# Patient Record
Sex: Female | Born: 1950 | Race: White | Hispanic: No | Marital: Married | Smoking: Never smoker
Health system: Southern US, Community
[De-identification: ages and names within clinical notes are randomized; demographics above are authoritative.]

## PROBLEM LIST (undated history)

## (undated) DIAGNOSIS — F329 Major depressive disorder, single episode, unspecified: Secondary | ICD-10-CM

## (undated) DIAGNOSIS — R519 Headache, unspecified: Secondary | ICD-10-CM

## (undated) DIAGNOSIS — M1990 Unspecified osteoarthritis, unspecified site: Secondary | ICD-10-CM

## (undated) DIAGNOSIS — N840 Polyp of corpus uteri: Secondary | ICD-10-CM

## (undated) DIAGNOSIS — M810 Age-related osteoporosis without current pathological fracture: Secondary | ICD-10-CM

## (undated) DIAGNOSIS — E0590 Thyrotoxicosis, unspecified without thyrotoxic crisis or storm: Secondary | ICD-10-CM

## (undated) DIAGNOSIS — F419 Anxiety disorder, unspecified: Secondary | ICD-10-CM

## (undated) DIAGNOSIS — K219 Gastro-esophageal reflux disease without esophagitis: Secondary | ICD-10-CM

## (undated) DIAGNOSIS — R51 Headache: Secondary | ICD-10-CM

## (undated) DIAGNOSIS — D649 Anemia, unspecified: Secondary | ICD-10-CM

## (undated) DIAGNOSIS — M8580 Other specified disorders of bone density and structure, unspecified site: Secondary | ICD-10-CM

## (undated) DIAGNOSIS — J189 Pneumonia, unspecified organism: Secondary | ICD-10-CM

## (undated) DIAGNOSIS — G459 Transient cerebral ischemic attack, unspecified: Secondary | ICD-10-CM

## (undated) DIAGNOSIS — Z8489 Family history of other specified conditions: Secondary | ICD-10-CM

## (undated) DIAGNOSIS — F32A Depression, unspecified: Secondary | ICD-10-CM

## (undated) DIAGNOSIS — Z9889 Other specified postprocedural states: Secondary | ICD-10-CM

## (undated) DIAGNOSIS — R112 Nausea with vomiting, unspecified: Secondary | ICD-10-CM

## (undated) DIAGNOSIS — E7800 Pure hypercholesterolemia, unspecified: Secondary | ICD-10-CM

## (undated) HISTORY — PX: HYSTEROSCOPY: SHX211

## (undated) HISTORY — DX: Unspecified osteoarthritis, unspecified site: M19.90

## (undated) HISTORY — PX: TOTAL HIP ARTHROPLASTY: SHX124

## (undated) HISTORY — DX: Thyrotoxicosis, unspecified without thyrotoxic crisis or storm: E05.90

## (undated) HISTORY — PX: APPENDECTOMY: SHX54

## (undated) HISTORY — PX: NOSE SURGERY: SHX723

## (undated) HISTORY — PX: NASAL SINUS SURGERY: SHX719

## (undated) HISTORY — PX: DILATION AND CURETTAGE OF UTERUS: SHX78

## (undated) HISTORY — PX: OTHER SURGICAL HISTORY: SHX169

## (undated) HISTORY — DX: Other specified disorders of bone density and structure, unspecified site: M85.80

## (undated) HISTORY — DX: Polyp of corpus uteri: N84.0

## (undated) HISTORY — DX: Pure hypercholesterolemia, unspecified: E78.00

## (undated) HISTORY — DX: Anemia, unspecified: D64.9

## (undated) HISTORY — PX: TUBAL LIGATION: SHX77

## (undated) HISTORY — PX: SHOULDER SURGERY: SHX246

## (undated) HISTORY — PX: TOTAL HIP REVISION: SHX763

## (undated) HISTORY — PX: TONSILLECTOMY: SUR1361

---

## 1997-07-25 ENCOUNTER — Other Ambulatory Visit: Admission: RE | Admit: 1997-07-25 | Discharge: 1997-07-25 | Payer: Self-pay | Admitting: Obstetrics and Gynecology

## 1998-09-03 ENCOUNTER — Other Ambulatory Visit: Admission: RE | Admit: 1998-09-03 | Discharge: 1998-09-03 | Payer: Self-pay | Admitting: Obstetrics and Gynecology

## 1999-09-03 ENCOUNTER — Other Ambulatory Visit: Admission: RE | Admit: 1999-09-03 | Discharge: 1999-09-03 | Payer: Self-pay | Admitting: Obstetrics and Gynecology

## 2000-09-06 ENCOUNTER — Other Ambulatory Visit: Admission: RE | Admit: 2000-09-06 | Discharge: 2000-09-06 | Payer: Self-pay | Admitting: Obstetrics and Gynecology

## 2001-09-08 ENCOUNTER — Other Ambulatory Visit: Admission: RE | Admit: 2001-09-08 | Discharge: 2001-09-08 | Payer: Self-pay | Admitting: Obstetrics and Gynecology

## 2002-08-17 ENCOUNTER — Encounter (INDEPENDENT_AMBULATORY_CARE_PROVIDER_SITE_OTHER): Payer: Self-pay | Admitting: Specialist

## 2002-08-17 ENCOUNTER — Ambulatory Visit (HOSPITAL_BASED_OUTPATIENT_CLINIC_OR_DEPARTMENT_OTHER): Admission: RE | Admit: 2002-08-17 | Discharge: 2002-08-17 | Payer: Self-pay | Admitting: Obstetrics and Gynecology

## 2002-09-22 ENCOUNTER — Other Ambulatory Visit: Admission: RE | Admit: 2002-09-22 | Discharge: 2002-09-22 | Payer: Self-pay | Admitting: Obstetrics and Gynecology

## 2003-09-24 ENCOUNTER — Other Ambulatory Visit: Admission: RE | Admit: 2003-09-24 | Discharge: 2003-09-24 | Payer: Self-pay | Admitting: Obstetrics and Gynecology

## 2004-09-24 ENCOUNTER — Other Ambulatory Visit: Admission: RE | Admit: 2004-09-24 | Discharge: 2004-09-24 | Payer: Self-pay | Admitting: Obstetrics and Gynecology

## 2005-09-28 ENCOUNTER — Other Ambulatory Visit: Admission: RE | Admit: 2005-09-28 | Discharge: 2005-09-28 | Payer: Self-pay | Admitting: Obstetrics and Gynecology

## 2006-08-23 ENCOUNTER — Inpatient Hospital Stay (HOSPITAL_COMMUNITY): Admission: RE | Admit: 2006-08-23 | Discharge: 2006-08-25 | Payer: Self-pay | Admitting: Orthopedic Surgery

## 2006-09-30 ENCOUNTER — Other Ambulatory Visit: Admission: RE | Admit: 2006-09-30 | Discharge: 2006-09-30 | Payer: Self-pay | Admitting: Obstetrics and Gynecology

## 2007-10-11 ENCOUNTER — Other Ambulatory Visit: Admission: RE | Admit: 2007-10-11 | Discharge: 2007-10-11 | Payer: Self-pay | Admitting: Obstetrics and Gynecology

## 2008-05-05 ENCOUNTER — Encounter: Admission: RE | Admit: 2008-05-05 | Discharge: 2008-05-05 | Payer: Self-pay | Admitting: Orthopedic Surgery

## 2008-06-11 ENCOUNTER — Ambulatory Visit: Payer: Self-pay | Admitting: Obstetrics and Gynecology

## 2008-06-20 ENCOUNTER — Ambulatory Visit: Payer: Self-pay | Admitting: Obstetrics and Gynecology

## 2008-09-17 ENCOUNTER — Ambulatory Visit: Payer: Self-pay | Admitting: Obstetrics and Gynecology

## 2008-10-15 ENCOUNTER — Ambulatory Visit: Payer: Self-pay | Admitting: Obstetrics and Gynecology

## 2008-10-15 ENCOUNTER — Other Ambulatory Visit: Admission: RE | Admit: 2008-10-15 | Discharge: 2008-10-15 | Payer: Self-pay | Admitting: Obstetrics and Gynecology

## 2008-10-15 ENCOUNTER — Encounter: Payer: Self-pay | Admitting: Obstetrics and Gynecology

## 2008-10-18 ENCOUNTER — Ambulatory Visit: Payer: Self-pay | Admitting: Obstetrics and Gynecology

## 2008-10-19 ENCOUNTER — Encounter: Payer: Self-pay | Admitting: Cardiology

## 2008-10-27 DIAGNOSIS — E785 Hyperlipidemia, unspecified: Secondary | ICD-10-CM | POA: Insufficient documentation

## 2008-10-29 DIAGNOSIS — M899 Disorder of bone, unspecified: Secondary | ICD-10-CM | POA: Insufficient documentation

## 2008-10-29 DIAGNOSIS — M949 Disorder of cartilage, unspecified: Secondary | ICD-10-CM

## 2008-10-29 DIAGNOSIS — R142 Eructation: Secondary | ICD-10-CM

## 2008-10-29 DIAGNOSIS — R141 Gas pain: Secondary | ICD-10-CM | POA: Insufficient documentation

## 2008-10-29 DIAGNOSIS — R143 Flatulence: Secondary | ICD-10-CM

## 2008-10-29 DIAGNOSIS — F329 Major depressive disorder, single episode, unspecified: Secondary | ICD-10-CM

## 2008-10-30 ENCOUNTER — Ambulatory Visit: Payer: Self-pay | Admitting: Cardiology

## 2008-10-30 DIAGNOSIS — E782 Mixed hyperlipidemia: Secondary | ICD-10-CM

## 2008-11-22 ENCOUNTER — Telehealth: Payer: Self-pay | Admitting: Cardiology

## 2009-01-03 ENCOUNTER — Encounter: Admission: RE | Admit: 2009-01-03 | Discharge: 2009-01-03 | Payer: Self-pay | Admitting: Obstetrics and Gynecology

## 2009-02-13 ENCOUNTER — Ambulatory Visit: Payer: Self-pay | Admitting: Cardiology

## 2009-02-13 LAB — CONVERTED CEMR LAB
ALT: 18 units/L (ref 0–35)
Albumin: 3.9 g/dL (ref 3.5–5.2)
Alkaline Phosphatase: 78 units/L (ref 39–117)
Bilirubin, Direct: 0.1 mg/dL (ref 0.0–0.3)
LDL Cholesterol: 103 mg/dL — ABNORMAL HIGH (ref 0–99)
Total Protein: 6.8 g/dL (ref 6.0–8.3)
Triglycerides: 90 mg/dL (ref 0.0–149.0)
VLDL: 18 mg/dL (ref 0.0–40.0)

## 2009-02-16 HISTORY — PX: BREAST SURGERY: SHX581

## 2009-02-20 ENCOUNTER — Telehealth: Payer: Self-pay | Admitting: Cardiology

## 2009-02-21 ENCOUNTER — Ambulatory Visit (HOSPITAL_BASED_OUTPATIENT_CLINIC_OR_DEPARTMENT_OTHER): Admission: RE | Admit: 2009-02-21 | Discharge: 2009-02-21 | Payer: Self-pay | Admitting: Surgery

## 2009-02-21 ENCOUNTER — Encounter: Admission: RE | Admit: 2009-02-21 | Discharge: 2009-02-21 | Payer: Self-pay | Admitting: Surgery

## 2009-03-08 ENCOUNTER — Telehealth (INDEPENDENT_AMBULATORY_CARE_PROVIDER_SITE_OTHER): Payer: Self-pay | Admitting: *Deleted

## 2009-05-02 ENCOUNTER — Ambulatory Visit (HOSPITAL_COMMUNITY): Admission: RE | Admit: 2009-05-02 | Discharge: 2009-05-02 | Payer: Self-pay | Admitting: Orthopedic Surgery

## 2009-10-16 ENCOUNTER — Ambulatory Visit: Payer: Self-pay | Admitting: Obstetrics and Gynecology

## 2009-10-16 ENCOUNTER — Other Ambulatory Visit: Admission: RE | Admit: 2009-10-16 | Discharge: 2009-10-16 | Payer: Self-pay | Admitting: Obstetrics and Gynecology

## 2009-10-31 ENCOUNTER — Telehealth: Payer: Self-pay | Admitting: Cardiology

## 2009-12-20 ENCOUNTER — Ambulatory Visit: Payer: Self-pay | Admitting: Cardiology

## 2009-12-27 ENCOUNTER — Telehealth: Payer: Self-pay | Admitting: Cardiology

## 2009-12-27 LAB — CONVERTED CEMR LAB
AST: 27 units/L (ref 0–37)
Albumin: 4.4 g/dL (ref 3.5–5.2)
Alkaline Phosphatase: 77 units/L (ref 39–117)
Bilirubin, Direct: 0.1 mg/dL (ref 0.0–0.3)
Cholesterol: 215 mg/dL — ABNORMAL HIGH (ref 0–200)
Total Protein: 6.9 g/dL (ref 6.0–8.3)
Triglycerides: 101 mg/dL (ref 0.0–149.0)

## 2010-03-09 ENCOUNTER — Encounter: Payer: Self-pay | Admitting: Orthopedic Surgery

## 2010-03-18 NOTE — Progress Notes (Signed)
Summary: test results  Phone Note Call from Patient Call back at Home Phone [phone]   Caller: Patient Reason for Call: Lab or Test Results Initial call taken by: Judie Grieve,  December 27, 2009 1:56 PM  Follow-up for Phone Call        I spoke with the pt. Follow-up by: Sherri Rad, RN, BSN,  December 27, 2009 2:21 PM

## 2010-03-18 NOTE — Progress Notes (Signed)
Summary: REFILL  Phone Note Refill Request Message from:  Patient on March 08, 2009 10:47 AM  Refills Requested: Medication #1:  CRESTOR 10 MG TABS 1 once daily. SENT  [facility]  [address] [phone] SENT TO WRONG PHARMACY  Initial call taken by: Judie Grieve,  March 08, 2009 10:48 AM  Follow-up for Phone Call        Rx faxed to [facility] on [address] Follow-up by: Vikki Ports,  March 08, 2009 11:15 AM    Prescriptions: CRESTOR 10 MG TABS (ROSUVASTATIN CALCIUM) 1 once daily  #30 x 6   Entered by:   Vikki Ports   Authorized by:   Gaylord Shih, MD, [facility]   Signed by:   Vikki Ports on 03/08/2009   Method used:   Faxed to ...       [facility] (retail)       [address]       [place], [place]  [id number]       Ph: [phone]       Fax: [phone]   RxID:   [id number]

## 2010-03-18 NOTE — Progress Notes (Signed)
Summary: lab results  Phone Note Call from Patient Call back at [phone]   Caller: Patient Reason for Call: Lab or Test Results Summary of Call: request lab results Initial call taken by: Migdalia Dk,  February 20, 2009 9:18 AM  Follow-up for Phone Call        EXCELLENT RESPONSE TO CRESTOR! NO CHANGE. RECHECK 1 YEAR Follow-up by: Gaylord Shih, MD, [facility],  February 20, 2009 10:31 AM     Appended Document: lab results PT AWARE./CY

## 2010-03-18 NOTE — Progress Notes (Signed)
Summary: questions re order   Phone Note Call from Patient   Caller: Patient Reason for Call: Talk to Nurse Summary of Call: pt has appt 9-19 w/dr Sarah Villarreal -pt requesting to have her cholesteral checked-can we get order-also she doesn't want to make two trips, her appt is at 3p, so she will have to rs with Sarah Villarreal in order to do am labs the same day Initial call taken by: Glynda Jaeger,  October 31, 2009 4:04 PM  Follow-up for Phone Call        I spoke with the pt. I offered to r/s her to 9/20 @ 9:30am with Dr. Daleen Villarreal so she can come fasting. She states she cannot come that date. Appt with Dr. Daleen Villarreal r/s to 10/13. The pt will come fasting so we can check labs the same day. Follow-up by: Sherri Rad, RN, BSN,  October 31, 2009 4:45 PM

## 2010-03-18 NOTE — Assessment & Plan Note (Signed)
Summary: yearly f/u/cy   Primary Provider:  Dr. Oletha Blend   History of Present Illness: Ms Sarah Villarreal returns today for evaluation and management of her hyperlipidemia. Her numbers look remarkably good last year with total cholesterol 191, HDL 70, LDL 103 triglycerides of 90 on just 5 mg of Crestor day. Her weight has been stable. She is not exercising regularly so however. Her she denies any chest pain or ischemic symptoms.  Current Medications (verified): 1)  Paxil 20 Mg Tabs (Paroxetine Hcl) .Marland Kitchen.. 1 Tab Once Daily 2)  Synthroid 75 Mcg Tabs (Levothyroxine Sodium) .Marland Kitchen.. 1 Tab Once Daily 3)  Dyazide 37.5-25 Mg Caps (Triamterene-Hctz) .Marland Kitchen.. 1 Tab Two Times A Day 4)  Crestor 10 Mg Tabs (Rosuvastatin Calcium) .... 1/2 By Mouth Daily  Allergies (verified): 1)  ! Codeine 2)  ! Demerol  Past History:  Past Medical History: Last updated: 10/27/2008 HYPERLIPIDEMIA (ICD-272.4) DEPRESSION (ICD-311) OSTEOPENIA (ICD-733.90) ABDOMINAL BLOATING (ICD-787.3)    Past Surgical History: Last updated: 10/27/2008 Hip Arthroplasty-Total..08/23/2006 .Marland KitchenRobert A. Thurston Hole, M.D. Excision of abdominal lipoma... SURGEON:  Daniel L. Gottsegen, M.D...08/17/2002  Family History: Last updated: 10/27/2008 noncontributory  Social History: Last updated: 10/27/2008 Alcohol Use - yes..seldom Tobacco Use - No.   Risk Factors: Smoking Status: never (10/27/2008)  Review of Systems       negative other than history of present illness  Vital Signs:  Patient profile:   60 year old female Height:      64 inches Weight:      124 pounds BMI:     21.36 Pulse rate:   59 / minute Resp:     16 per minute BP sitting:   112 / 66  (left arm)  Vitals Entered By: Marrion Coy, CNA (December 20, 2009 9:52 AM)  Physical Exam  General:  Well developed, well nourished, in no acute distress. Head:  normocephalic and atraumatic Eyes:  PERRLA/EOM intact; conjunctiva and lids normal. Neck:  Neck supple, no JVD. No  masses, thyromegaly or abnormal cervical nodes. Chest Sarah Villarreal:  no deformities or breast masses noted Lungs:  Clear bilaterally to auscultation and percussion. Heart:  PMI nondisplaced, regular rate and rhythm, normal S1-S2, no carotid bruit Abdomen:  Bowel sounds positive; abdomen soft and non-tender without masses, organomegaly, or hernias noted. No hepatosplenomegaly. Msk:  Back normal, normal gait. Muscle strength and tone normal. Pulses:  pulses normal in all 4 extremities Extremities:  No clubbing or cyanosis. Neurologic:  Alert and oriented x 3. Skin:  Intact without lesions or rashes. Psych:  Normal affect.   EKG  Procedure date:  12/20/2009  Findings:      sinus bradycardia, no acute changes  Impression & Recommendations:  Problem # 1:  HYPERLIPIDEMIA TYPE IIB / III (ICD-272.2) draw fasting lipids and LFTs today. No change in treatment. Her updated medication list for this problem includes:    Crestor 10 Mg Tabs (Rosuvastatin calcium) .Marland Kitchen... 1/2 by mouth daily  Other Orders: EKG w/ Interpretation (93000) TLB-Lipid Panel (80061-LIPID) TLB-Hepatic/Liver Function Pnl (80076-HEPATIC)  Patient Instructions: 1)  Your physician recommends that you schedule a follow-up appointment in:  2)  Your physician recommends that you return for a FASTING lipid profile:  3)  Your physician recommends that you continue on your current medications as directed. Please refer to the Current Medication list given to you today.

## 2010-05-04 LAB — BASIC METABOLIC PANEL
BUN: 10 mg/dL (ref 6–23)
Calcium: 9.5 mg/dL (ref 8.4–10.5)
Chloride: 100 mEq/L (ref 96–112)
Creatinine, Ser: 0.71 mg/dL (ref 0.4–1.2)
GFR calc Af Amer: >60 mL/min (ref >60)

## 2010-05-04 LAB — POCT HEMOGLOBIN-HEMACUE: Hemoglobin: 13.4 g/dL (ref 12.0–15.0)

## 2010-07-01 NOTE — Op Note (Signed)
NAMECERITA, RABELO               ACCOUNT NO.:  [id number]   MEDICAL RECORD NO.:  [id number]          PATIENT TYPE:  INP   LOCATION:  2550                         FACILITY:  [facility]   PHYSICIAN:  Robert A. Thurston Hole, M.D. DATE OF BIRTH:  28-Jul-1950   DATE OF PROCEDURE:  08/23/2006  DATE OF DISCHARGE:                               OPERATIVE REPORT   PREOPERATIVE DIAGNOSIS:  Right hip degenerative joint disease.   POSTOPERATIVE DIAGNOSIS:  Right hip degenerative joint disease.   PROCEDURE PERFORMED:  Right total hip replacement using DePuy S-ROM  Press-Fit total hip system with acetabulum 50 mm ASR Press-Fit  acetabular shell with femoral component, 18 x 13 femoral stem with a 36  mm +3 femoral neck with 45 mm ASR head with 18-F-small sleeve.   SURGEON:  Elana Alm. Thurston Hole, M.D.   ASSISTANT SURGEON:  Julien Girt, P.A.   ANESTHESIA:  General.   OPERATIVE TIME:  One hour and 15 minutes.   ESTIMATED BLOOD LOSS:  150 mL.   COMPLICATIONS:  None.   DESCRIPTION OF PROCEDURE:  Ms. Rizzolo was brought into the operating  room on August 23, 2006 and was placed on the operative table in the supine  position.  After being placed under general anesthesia, she received  Ancef 1 gm IV preoperatively for prophylaxis.  She had a Foley catheter  placed under sterile conditions.  Her right hip was then examined.  She  had near full range of motion, the hip was stable, leg lengths were  approximately equal.  She was then placed in the right lateral decubitus  position and secured on the bed with a Mark frame.  Her right hip and  leg were then prepped using sterile Betadine and DuraPrep and draped  using sterile technique.   Originally through a 7 cm curvilinear posterolateral incision over the  greater trochanter, initial exposure was made.  Then the subcutaneous  tissue were incised along with the skin incision.  Iliotibial band  including the gluteus maximus fascia was incised longitudinally,  revealing the underlying sciatic nerve which was carefully protected.  The short external rotators of the hip and hip capsule were released off  the femoral neck insertions and tagged and then the hip was posteriorly  dislocated.  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  A femoral neck cut was then made 2 cm above  the lesser trochanter using the S-ROM guide and the femoral head was  removed.  At this point carefully placed retractors were placed around  the acetabulum.  She was found to have grade III and IV DJD in the  acetabulum as well.  Degenerative acetabular labral tissue was then  removed.  Sequentially acetabular reaming was carried out up to a 49 mm  reamer in the appropriate amount of anteversion, abduction and  inclination and then a 50 mm trial acetabular shell was placed in the  appropriate amount of anteversion, abduction and inclination with an  excellent press fit.  It was then removed and the actual 50 ASR  acetabular shell was  hammered into position with an excellent press fit,  again with the appropriate amount of anteversion, abduction and  inclination.   At this point, then, the proximal femur was then exposed.  Sequential  axial reaming was carried out to a 13.5 size and then proximal cone  reamer to an 18 size with a size F in diameter.  After this was done,  then, the conical S-ROM sleeve reamer was used up to a small size.  The  18-F-small sleeve trial was then placed with an excellent fit and then,  with the 18 x 13 femoral trial in 0 anteversion on the sleeve which  actually was 30 degrees of actual anteversion, a +3 x 45 mm head and  neck trial was placed and the hip reduced, taken through a full range of  motion and found to be stable and leg lengths equal.   At this point it was felt that all the trial components were of  excellent size, fit and stability.  The femoral trials were then  removed.  The femoral  canal was then further jet lavage irrigated and  then the 18-F-small sleeve was hammered into position with an excellent  fit, and then the 18 x 13 femoral stem with the 36 mm neck was hammered  into position also with an excellent fit with a neutral position on the  sleeve which actually gave her 30 degrees of anteversion.  The +3 x 45  mm ASR head was then hammered onto the femoral neck with an excellent  Morse taper fit.   The hip was then reduced, taken through a full range of motion, found to  be stable up to 80-90 degrees of internal rotation and both neutral and  30 degrees of adduction, also stable in abduction and external rotation.  Leg lengths were equal.  At this point it was felt that all the  components were of excellent size, fit and stability.  The wound was  further irrigated.  The short external rotators of the hip and hip  capsule were then reattached to their femoral neck insertions through  two drill holes in the greater trochanter.  Iliotibial band including  gluteus maximus fascia was closed with #1 Ethibond sutures; the  subcutaneous tissues were closed with 0 and 2-0 Vicryl; the subcuticular  layer closed with 4-0 Monocryl.  Sterile dressings were applied.   The patient was then turned supine, checked for leg lengths that were  equal, rotation was equal, pulses and neurovascular status 2+ and  symmetric.  She was placed in a knee immobilizer and then she was  awakened, extubated and taken to the recovery room in stable condition.  Needle and sponge counts were correct x two at the end of the case.      Robert A. Thurston Hole, M.D.  Electronically Signed     RAW/MEDQ  D:  08/23/2006  T:  08/23/2006  Job:  [id number]

## 2010-07-04 NOTE — Op Note (Signed)
   NAME:  Sarah Villarreal, Sarah Villarreal                         ACCOUNT NO.:  [id number]   MEDICAL RECORD NO.:  [id number]                   PATIENT TYPE:  AMB   LOCATION:  [facility]                                 FACILITY:  [facility]   PHYSICIAN:  Daniel L. Eda Paschal, M.D.           DATE OF BIRTH:  1950/03/25   DATE OF PROCEDURE:  08/17/2002  DATE OF DISCHARGE:                                 OPERATIVE REPORT   PREOPERATIVE DIAGNOSIS:  Abdominal mass, probable lipoma.   POSTOPERATIVE DIAGNOSIS:  Lipoma of the abdomen.   OPERATION/PROCEDURE:  Excision of abdominal lipoma.   SURGEON:  Daniel L. Eda Paschal, M.D.   ANESTHESIA:  General.   INDICATIONS:  The patient is a 60 year old female who presented to the  office with a little mass growing out the top of her mons of about 2.5 cm.  It is grown quickly and is uncomfortable and the patient would like it  removed.   FINDINGS:  At the time of surgery, the patient had a lipoma.  It was well  encapsulated but it was multi-lobe, rather than being a single lesion.  Total size was about 2.5 cm.   DESCRIPTION OF PROCEDURE:  After adequate general anesthesia the patient was  placed in the supine position, prepped and draped in the usual sterile  manner.  A small incision was made over the mass of about 3 cm.  Dissection  was carried down to the mass.  The mass was elevated without an Allis clamp  and dissection was taken around it without invading the capsule of the mass  at all.  The mass was removed and sent to pathology for tissue diagnosis.  Hemostasis was obtained with a bipolar.  Copious irrigation was done.  Subcutaneous tissue was approximated with 3-0 plain and then the skin was  approximated with a 3-0 subcuticular suture and Steri-Strips.  Estimated  blood loss for the entire procedure was 10 mL with none replaced.  The  patient tolerated the procedure well and left the operating room in  satisfactory condition.                Daniel L. Eda Paschal, M.D.    Tonette Bihari  D:  08/17/2002  T:  08/17/2002  Job:  [id number]

## 2010-09-23 ENCOUNTER — Ambulatory Visit (INDEPENDENT_AMBULATORY_CARE_PROVIDER_SITE_OTHER): Payer: Self-pay | Admitting: Gynecology

## 2010-09-23 ENCOUNTER — Encounter: Payer: Self-pay | Admitting: *Deleted

## 2010-09-23 ENCOUNTER — Other Ambulatory Visit: Payer: Self-pay

## 2010-09-23 DIAGNOSIS — M899 Disorder of bone, unspecified: Secondary | ICD-10-CM

## 2010-09-23 DIAGNOSIS — M949 Disorder of cartilage, unspecified: Secondary | ICD-10-CM

## 2010-10-08 ENCOUNTER — Other Ambulatory Visit: Payer: Self-pay | Admitting: Obstetrics and Gynecology

## 2010-10-08 NOTE — Telephone Encounter (Signed)
Refill on Ambien 10mg  #90 no refills called to [facility].

## 2010-11-04 ENCOUNTER — Encounter: Payer: Self-pay | Admitting: Obstetrics and Gynecology

## 2010-12-02 LAB — TYPE AND SCREEN
ABO/RH(D): B POS
Antibody Screen: NEGATIVE

## 2010-12-02 LAB — COMPREHENSIVE METABOLIC PANEL
Albumin: 4.2
Alkaline Phosphatase: 65
BUN: 12
Calcium: 10.4
Creatinine, Ser: 0.59
GFR calc Af Amer: >60
Glucose, Bld: 107 — ABNORMAL HIGH
Potassium: 3 — ABNORMAL LOW
Total Protein: 7.1

## 2010-12-02 LAB — CBC
MCHC: 34.4
MCV: 91.5
MCV: 92.7
Platelets: 238
RBC: 4.49
RDW: 12.7
RDW: 13.4
WBC: 10.8 — ABNORMAL HIGH
WBC: 12.6 — ABNORMAL HIGH

## 2010-12-02 LAB — APTT: aPTT: 26

## 2010-12-02 LAB — PROTIME-INR
INR: 0.9
Prothrombin Time: 12.7
Prothrombin Time: 20 — ABNORMAL HIGH

## 2010-12-02 LAB — BASIC METABOLIC PANEL
BUN: 6
CO2: 30
Calcium: 8.9
Chloride: 93 — ABNORMAL LOW
Creatinine, Ser: 0.53
Creatinine, Ser: 0.64
GFR calc non Af Amer: >60
Glucose, Bld: 114 — ABNORMAL HIGH
Glucose, Bld: 123 — ABNORMAL HIGH

## 2010-12-02 LAB — URINALYSIS, ROUTINE W REFLEX MICROSCOPIC
Bilirubin Urine: NEGATIVE
Glucose, UA: NEGATIVE
Ketones, ur: NEGATIVE
pH: 7

## 2010-12-02 LAB — DIFFERENTIAL
Basophils Absolute: 0.1
Basophils Relative: 1
Eosinophils Relative: 2
Monocytes Absolute: 0.8 — ABNORMAL HIGH
Neutro Abs: 6.6

## 2010-12-02 LAB — URINE CULTURE
Colony Count: NO GROWTH
Culture: NO GROWTH

## 2010-12-05 ENCOUNTER — Other Ambulatory Visit: Payer: Self-pay | Admitting: *Deleted

## 2010-12-05 ENCOUNTER — Other Ambulatory Visit: Payer: Self-pay | Admitting: Obstetrics and Gynecology

## 2010-12-05 MED ORDER — TRIAMTERENE-HCTZ 37.5-25 MG PO CAPS: 1.0000 | ORAL_CAPSULE | ORAL | Status: DC

## 2010-12-08 ENCOUNTER — Other Ambulatory Visit: Payer: Self-pay | Admitting: Obstetrics and Gynecology

## 2010-12-09 ENCOUNTER — Encounter: Payer: Self-pay | Admitting: Obstetrics and Gynecology

## 2011-03-09 ENCOUNTER — Other Ambulatory Visit: Payer: Self-pay | Admitting: Obstetrics and Gynecology

## 2011-03-27 ENCOUNTER — Telehealth: Payer: Self-pay | Admitting: *Deleted

## 2011-03-27 NOTE — Telephone Encounter (Signed)
Pt calling wanting more medication for her Dyazide 37.5-25 mg pt said she takes two a day rather than 1 a day. Pt requesting 30 more pills. She had no annual done in 2012, but has one scheduled on feb 28. Pt informed Dr.G is out of the office and i will send a message once back in office for the okay.

## 2011-03-31 ENCOUNTER — Telehealth: Payer: Self-pay

## 2011-03-31 NOTE — Telephone Encounter (Signed)
Patient's last exam was August of 2011.  She has CE scheduled Feb 28 with Dr. Reece Agar. She needs refill on her generic Dyazide. She said she takes them BID and today is her last day.  She has called twice today about it and had talked to Normal earlier in the week. Victorino Dike had told her she would check with Dr. Reece Agar on his return Thurs but patient thought he was returning today and is greatly concerned about missing one day.  I asked her what she takes them for and she said fluid retention "and they are for blood pressure". She said that is what worries her about missing a day.  She wants to get #30 so she can take two daily until her CE.  She wanted me to try to get her RX for today.

## 2011-03-31 NOTE — Telephone Encounter (Signed)
I called patient back and let her know I messaged Dr. Reece Agar and it is 4:45pm and I have not heard back and will wait to hear from him.  She is fine with that. I told her the docs periodically check their in baskets when off and I may hear from Dr. Reece Agar by tomorrow morning when we re-open.  She is much calmer about it now and said "I will be fine" "Sorry to be so much trouble".

## 2011-04-01 ENCOUNTER — Other Ambulatory Visit: Payer: Self-pay

## 2011-04-01 MED ORDER — TRIAMTERENE-HCTZ 50-25 MG PO CAPS: 1.0000 | ORAL_CAPSULE | ORAL | Status: DC

## 2011-04-01 MED ORDER — TRIAMTERENE-HCTZ 37.5-25 MG PO CAPS: 1.0000 | ORAL_CAPSULE | Freq: Two times a day (BID) | ORAL | Status: DC

## 2011-04-01 MED ORDER — TRIAMTERENE-HCTZ 37.5-25 MG PO CAPS: 1.0000 | ORAL_CAPSULE | ORAL | Status: DC

## 2011-04-01 NOTE — Telephone Encounter (Signed)
Patient informed Dr. Reece Agar gave her RX with refills. I escribed to her pharmacy.

## 2011-04-01 NOTE — Telephone Encounter (Signed)
Okay to refill. Give her 65 with 6 refills.

## 2011-04-16 ENCOUNTER — Encounter: Payer: Self-pay | Admitting: Obstetrics and Gynecology

## 2011-05-07 ENCOUNTER — Encounter: Payer: Self-pay | Admitting: Obstetrics and Gynecology

## 2011-05-07 ENCOUNTER — Ambulatory Visit (INDEPENDENT_AMBULATORY_CARE_PROVIDER_SITE_OTHER): Payer: Self-pay | Admitting: Obstetrics and Gynecology

## 2011-05-07 VITALS — BP 116/74 | Ht 63.0 in | Wt 131.0 lb

## 2011-05-07 DIAGNOSIS — N840 Polyp of corpus uteri: Secondary | ICD-10-CM | POA: Insufficient documentation

## 2011-05-07 DIAGNOSIS — E7800 Pure hypercholesterolemia, unspecified: Secondary | ICD-10-CM | POA: Insufficient documentation

## 2011-05-07 DIAGNOSIS — Z01419 Encounter for gynecological examination (general) (routine) without abnormal findings: Secondary | ICD-10-CM

## 2011-05-07 MED ORDER — AMOXICILLIN 500 MG PO TABS: 500.0000 mg | ORAL_TABLET | Freq: Two times a day (BID) | ORAL | Status: DC

## 2011-05-07 MED ORDER — TRIAMTERENE-HCTZ 37.5-25 MG PO CAPS: 1.0000 | ORAL_CAPSULE | Freq: Two times a day (BID) | ORAL | Status: DC

## 2011-05-07 NOTE — Progress Notes (Signed)
Patient came to see me today for her annual GYN exam. She is not having vaginal bleeding or pelvic pain. She continues to have intermittent hot flashes. She feels she can tolerate them without HRT. She is however having severe dyspareunia due to vaginal dryness. She is due for a mammogram. She's currently without insurance. She does her lab through PCP. She had a bone density here last year. It showed low bone mass without an elevated FRAX risk. She has had no fractures. She is complaining of acute sinusitis with drainage today and wanted me to treat her. She does not do well with Z- pack. She uses a diuretic for fluid retention with good results.  Physical examination:Kim Julian Reil present. HEENT within normal limits. Neck: Thyroid not large. No masses. Supraclavicular nodes: not enlarged. Breasts: Examined in both sitting and lying  position. No skin changes and no masses. Abdomen: Soft no guarding rebound or masses or hernia. Pelvic: External: Within normal limits. BUS: Within normal limits. Vaginal:within normal limits. Good estrogen effect. No evidence of cystocele rectocele or enterocele. Cervix: clean. Uterus: Normal size and shape. Adnexa: No masses. Rectovaginal exam: Confirmatory and negative. Extremities: Within normal limits.  Assessment: Atrophic vaginitis. Lower extremity fluid retention. Osteopenia. Sinusitis.  Plan: Estradiol vaginal cream .02% vaginally 3 times a week. Amoxicillin for 10 days for sinusitis. Continue Dyazide. Mammogram. Gave patient information on mammographic scolarship.

## 2011-05-08 LAB — URINALYSIS W MICROSCOPIC + REFLEX CULTURE
Bacteria, UA: NONE SEEN
Bilirubin Urine: NEGATIVE
Hgb urine dipstick: NEGATIVE
Ketones, ur: NEGATIVE mg/dL
Protein, ur: NEGATIVE mg/dL
Squamous Epithelial / LPF: NONE SEEN
Urobilinogen, UA: 0.2 mg/dL (ref 0.0–1.0)

## 2012-05-14 ENCOUNTER — Other Ambulatory Visit: Payer: Self-pay | Admitting: Obstetrics and Gynecology

## 2012-06-15 ENCOUNTER — Other Ambulatory Visit: Payer: Self-pay

## 2013-12-18 ENCOUNTER — Encounter: Payer: Self-pay | Admitting: Obstetrics and Gynecology

## 2014-02-16 DIAGNOSIS — M8580 Other specified disorders of bone density and structure, unspecified site: Secondary | ICD-10-CM

## 2014-02-16 HISTORY — DX: Other specified disorders of bone density and structure, unspecified site: M85.80

## 2014-08-06 ENCOUNTER — Encounter: Payer: Self-pay | Admitting: Gynecology

## 2014-09-21 ENCOUNTER — Encounter: Payer: Self-pay | Admitting: Gynecology

## 2014-09-21 ENCOUNTER — Ambulatory Visit (INDEPENDENT_AMBULATORY_CARE_PROVIDER_SITE_OTHER): Payer: No Typology Code available for payment source | Admitting: Gynecology

## 2014-09-21 ENCOUNTER — Other Ambulatory Visit (HOSPITAL_COMMUNITY)
Admission: RE | Admit: 2014-09-21 | Discharge: 2014-09-21 | Disposition: A | Discharge status: Home / Self Care | Payer: No Typology Code available for payment source | Source: Ambulatory Visit | Attending: Gynecology | Admitting: Gynecology

## 2014-09-21 VITALS — BP 122/76 | Ht 63.0 in | Wt 122.0 lb

## 2014-09-21 DIAGNOSIS — Z01419 Encounter for gynecological examination (general) (routine) without abnormal findings: Secondary | ICD-10-CM

## 2014-09-21 DIAGNOSIS — M8580 Other specified disorders of bone density and structure, unspecified site: Secondary | ICD-10-CM | POA: Diagnosis not present

## 2014-09-21 DIAGNOSIS — N952 Postmenopausal atrophic vaginitis: Secondary | ICD-10-CM

## 2014-09-21 NOTE — Addendum Note (Signed)
Addended by: Dayna Barker on: 09/21/2014 11:43 AM   Modules accepted: Orders

## 2014-09-21 NOTE — Progress Notes (Signed)
Sarah Villarreal [facility] April 06, 1950 [id number]        64 y.o.  [id number] for annual exam.  Former patient of Dr. Eda Paschal. Several issues noted below.  Past medical history,surgical history, problem list, medications, allergies, family history and social history were all reviewed and documented as reviewed in the EPIC chart.  ROS:  Performed with pertinent positives and negatives included in the history, assessment and plan.   Additional significant findings :  none   Exam: [facility] person Vitals:   09/21/14 1039  BP: 122/76  Height:  (1.6 m)  Weight: 122 lb (55.339 kg)   General appearance:  Normal affect, orientation and appearance. Skin: Grossly normal HEENT: Without gross lesions.  No cervical or supraclavicular adenopathy. Thyroid normal.  Lungs:  Clear without wheezing, rales or rhonchi Cardiac: RR, without RMG Abdominal:  Soft, nontender, without masses, guarding, rebound, organomegaly or hernia Breasts:  Examined lying and sitting without masses, retractions, discharge or axillary adenopathy. Pelvic:  Ext/BUS/vagina with atrophic changes  Cervix with atrophic changes. Pap smear done  Uterus anteverted, normal size, shape and contour, midline and mobile nontender   Adnexa  Without masses or tenderness    Anus and perineum  Normal   Rectovaginal  Normal sphincter tone without palpated masses or tenderness.    Assessment/Plan:  64 y.o. [id number] female for annual exam.   1. Postmenopausal/atrophic genital changes. Doing well without significant hot flushes or night sweats. No vaginal bleeding. Does have some vaginal dryness with intercourse and uses OTC lubricants. I discussed vaginal estrogen with Dr. Eda Paschal but never started it and does not want to. We'll continue with OTC products and follow up as needed. Patient knows to report any vaginal bleeding. 2. Osteopenia. DEXA 2012 T score -2.1 FRAX 16%/2.5%. Repeat DEXA now and patient will schedule. Increased calcium vitamin D  reviewed. 3. Mammography due now and patient has scheduled next week.  SBE monthly reviewed. 4. Pap smear 2011. Pap smear done today. No history of significant abnormal Pap smears previously. 5. Colonoscopy 2015. Reported repeat interval 3 years due to polyps. 6. Health maintenance. No routine blood work done as this is done at her primary physician's office. Follow up for bone density otherwise 1 year, sooner as needed.   Dara Lords MD, 11:12 AM 09/21/2014

## 2014-09-21 NOTE — Patient Instructions (Signed)
Follow up for bone density as scheduled.  You may obtain a copy of any labs that were done today by logging onto MyChart as outlined in the instructions provided with your AVS (after visit summary). The office will not call with normal lab results but certainly if there are any significant abnormalities then we will contact you.   Health Maintenance, Female A healthy lifestyle and preventative care can promote health and wellness.  Maintain regular health, dental, and eye exams.  Eat a healthy diet. Foods like vegetables, fruits, whole grains, low-fat dairy products, and lean protein foods contain the nutrients you need without too many calories. Decrease your intake of foods high in solid fats, added sugars, and salt. Get information about a proper diet from your caregiver, if necessary.  Regular physical exercise is one of the most important things you can do for your health. Most adults should get at least 150 minutes of moderate-intensity exercise (any activity that increases your heart rate and causes you to sweat) each week. In addition, most adults need muscle-strengthening exercises on 2 or more days a week.   Maintain a healthy weight. The body mass index (BMI) is a screening tool to identify possible weight problems. It provides an estimate of body fat based on height and weight. Your caregiver can help determine your BMI, and can help you achieve or maintain a healthy weight. For adults 20 years and older:  A BMI below 18.5 is considered underweight.  A BMI of 18.5 to 24.9 is normal.  A BMI of 25 to 29.9 is considered overweight.  A BMI of 30 and above is considered obese.  Maintain normal blood lipids and cholesterol by exercising and minimizing your intake of saturated fat. Eat a balanced diet with plenty of fruits and vegetables. Blood tests for lipids and cholesterol should begin at age 20 and be repeated every 5 years. If your lipid or cholesterol levels are high, you are over  50, or you are a high risk for heart disease, you may need your cholesterol levels checked more frequently.Ongoing high lipid and cholesterol levels should be treated with medicines if diet and exercise are not effective.  If you smoke, find out from your caregiver how to quit. If you do not use tobacco, do not start.  Lung cancer screening is recommended for adults aged 55 80 years who are at high risk for developing lung cancer because of a history of smoking. Yearly low-dose computed tomography (CT) is recommended for people who have at least a 30-pack-year history of smoking and are a current smoker or have quit within the past 15 years. A pack year of smoking is smoking an average of 1 pack of cigarettes a day for 1 year (for example: 1 pack a day for 30 years or 2 packs a day for 15 years). Yearly screening should continue until the smoker has stopped smoking for at least 15 years. Yearly screening should also be stopped for people who develop a health problem that would prevent them from having lung cancer treatment.  If you are pregnant, do not drink alcohol. If you are breastfeeding, be very cautious about drinking alcohol. If you are not pregnant and choose to drink alcohol, do not exceed 1 drink per day. One drink is considered to be 12 ounces (355 mL) of beer, 5 ounces (148 mL) of wine, or 1.5 ounces (44 mL) of liquor.  Avoid use of street drugs. Do not share needles with anyone. Ask for help   if you need support or instructions about stopping the use of drugs.  High blood pressure causes heart disease and increases the risk of stroke. Blood pressure should be checked at least every 1 to 2 years. Ongoing high blood pressure should be treated with medicines, if weight loss and exercise are not effective.  If you are 55 to 64 years old, ask your caregiver if you should take aspirin to prevent strokes.  Diabetes screening involves taking a blood sample to check your fasting blood sugar level.  This should be done once every 3 years, after age 45, if you are within normal weight and without risk factors for diabetes. Testing should be considered at a younger age or be carried out more frequently if you are overweight and have at least 1 risk factor for diabetes.  Breast cancer screening is essential preventative care for women. You should practice "breast self-awareness." This means understanding the normal appearance and feel of your breasts and may include breast self-examination. Any changes detected, no matter how small, should be reported to a caregiver. Women in their 20s and 30s should have a clinical breast exam (CBE) by a caregiver as part of a regular health exam every 1 to 3 years. After age 40, women should have a CBE every year. Starting at age 40, women should consider having a mammogram (breast X-ray) every year. Women who have a family history of breast cancer should talk to their caregiver about genetic screening. Women at a high risk of breast cancer should talk to their caregiver about having an MRI and a mammogram every year.  Breast cancer gene (BRCA)-related cancer risk assessment is recommended for women who have family members with BRCA-related cancers. BRCA-related cancers include breast, ovarian, tubal, and peritoneal cancers. Having family members with these cancers may be associated with an increased risk for harmful changes (mutations) in the breast cancer genes BRCA1 and BRCA2. Results of the assessment will determine the need for genetic counseling and BRCA1 and BRCA2 testing.  The Pap test is a screening test for cervical cancer. Women should have a Pap test starting at age 21. Between ages 21 and 29, Pap tests should be repeated every 2 years. Beginning at age 30, you should have a Pap test every 3 years as long as the past 3 Pap tests have been normal. If you had a hysterectomy for a problem that was not cancer or a condition that could lead to cancer, then you no  longer need Pap tests. If you are between ages 65 and 70, and you have had normal Pap tests going back 10 years, you no longer need Pap tests. If you have had past treatment for cervical cancer or a condition that could lead to cancer, you need Pap tests and screening for cancer for at least 20 years after your treatment. If Pap tests have been discontinued, risk factors (such as a new sexual partner) need to be reassessed to determine if screening should be resumed. Some women have medical problems that increase the chance of getting cervical cancer. In these cases, your caregiver may recommend more frequent screening and Pap tests.  The human papillomavirus (HPV) test is an additional test that may be used for cervical cancer screening. The HPV test looks for the virus that can cause the cell changes on the cervix. The cells collected during the Pap test can be tested for HPV. The HPV test could be used to screen women aged 30 years and older, and   should be used in women of any age who have unclear Pap test results. After the age of 30, women should have HPV testing at the same frequency as a Pap test.  Colorectal cancer can be detected and often prevented. Most routine colorectal cancer screening begins at the age of 50 and continues through age 75. However, your caregiver may recommend screening at an earlier age if you have risk factors for colon cancer. On a yearly basis, your caregiver may provide home test kits to check for hidden blood in the stool. Use of a small camera at the end of a tube, to directly examine the colon (sigmoidoscopy or colonoscopy), can detect the earliest forms of colorectal cancer. Talk to your caregiver about this at age 50, when routine screening begins. Direct examination of the colon should be repeated every 5 to 10 years through age 75, unless early forms of pre-cancerous polyps or small growths are found.  Hepatitis C blood testing is recommended for all people born from  1945 through 1965 and any individual with known risks for hepatitis C.  Practice safe sex. Use condoms and avoid high-risk sexual practices to reduce the spread of sexually transmitted infections (STIs). Sexually active women aged 25 and younger should be checked for Chlamydia, which is a common sexually transmitted infection. Older women with new or multiple partners should also be tested for Chlamydia. Testing for other STIs is recommended if you are sexually active and at increased risk.  Osteoporosis is a disease in which the bones lose minerals and strength with aging. This can result in serious bone fractures. The risk of osteoporosis can be identified using a bone density scan. Women ages 65 and over and women at risk for fractures or osteoporosis should discuss screening with their caregivers. Ask your caregiver whether you should be taking a calcium supplement or vitamin D to reduce the rate of osteoporosis.  Menopause can be associated with physical symptoms and risks. Hormone replacement therapy is available to decrease symptoms and risks. You should talk to your caregiver about whether hormone replacement therapy is right for you.  Use sunscreen. Apply sunscreen liberally and repeatedly throughout the day. You should seek shade when your shadow is shorter than you. Protect yourself by wearing long sleeves, pants, a wide-brimmed hat, and sunglasses year round, whenever you are outdoors.  Notify your caregiver of new moles or changes in moles, especially if there is a change in shape or color. Also notify your caregiver if a mole is larger than the size of a pencil eraser.  Stay current with your immunizations. Document Released: 08/18/2010 Document Revised: 05/30/2012 Document Reviewed: 08/18/2010 Exit[employer] Patient Information 2014 [employer].   

## 2014-09-22 LAB — URINALYSIS W MICROSCOPIC + REFLEX CULTURE
Bacteria, UA: NONE SEEN [HPF]
Bilirubin Urine: NEGATIVE
CASTS: NONE SEEN [LPF]
Crystals: NONE SEEN [HPF]
GLUCOSE, UA: NEGATIVE
Hgb urine dipstick: NEGATIVE
Ketones, ur: NEGATIVE
Leukocytes, UA: NEGATIVE
Nitrite: NEGATIVE
PH: 7.5 (ref 5.0–8.0)
Protein, ur: NEGATIVE
SPECIFIC GRAVITY, URINE: 1.015 (ref 1.001–1.035)
Squamous Epithelial / LPF: NONE SEEN [HPF] (ref <=5)
WBC, UA: NONE SEEN WBC/HPF (ref <=5)
Yeast: NONE SEEN [HPF]

## 2014-09-23 LAB — URINE CULTURE
Colony Count: NO GROWTH
Organism ID, Bacteria: NO GROWTH

## 2014-09-24 LAB — CYTOLOGY - PAP

## 2014-10-09 ENCOUNTER — Other Ambulatory Visit: Payer: Self-pay | Admitting: Gynecology

## 2014-10-09 ENCOUNTER — Ambulatory Visit (INDEPENDENT_AMBULATORY_CARE_PROVIDER_SITE_OTHER): Payer: No Typology Code available for payment source

## 2014-10-09 DIAGNOSIS — M8580 Other specified disorders of bone density and structure, unspecified site: Secondary | ICD-10-CM

## 2014-10-09 DIAGNOSIS — Z13820 Encounter for screening for osteoporosis: Secondary | ICD-10-CM | POA: Diagnosis not present

## 2014-10-10 ENCOUNTER — Encounter: Payer: Self-pay | Admitting: *Deleted

## 2014-10-16 ENCOUNTER — Encounter: Payer: Self-pay | Admitting: Gynecology

## 2015-10-10 ENCOUNTER — Encounter: Payer: Self-pay | Admitting: Gynecology

## 2015-11-22 ENCOUNTER — Encounter: Payer: Self-pay | Admitting: Gynecology

## 2015-11-22 ENCOUNTER — Ambulatory Visit (INDEPENDENT_AMBULATORY_CARE_PROVIDER_SITE_OTHER): Payer: Medicare Other | Admitting: Gynecology

## 2015-11-22 VITALS — BP 116/74 | Ht 63.5 in | Wt 129.0 lb

## 2015-11-22 DIAGNOSIS — M8580 Other specified disorders of bone density and structure, unspecified site: Secondary | ICD-10-CM

## 2015-11-22 DIAGNOSIS — N952 Postmenopausal atrophic vaginitis: Secondary | ICD-10-CM | POA: Diagnosis not present

## 2015-11-22 DIAGNOSIS — Z01419 Encounter for gynecological examination (general) (routine) without abnormal findings: Secondary | ICD-10-CM | POA: Diagnosis not present

## 2015-11-22 NOTE — Patient Instructions (Signed)

## 2015-11-22 NOTE — Progress Notes (Signed)
    Sarah FittingSandra W [facility] 04/12/1950 [id number]        65 y.o.  [id number]  for breast and pelvic exam.  Doing well.  Past medical history,surgical history, problem list, medications, allergies, family history and social history were all reviewed and documented as reviewed in the EPIC chart.  ROS:  Performed with pertinent positives and negatives included in the history, assessment and plan.   Additional significant findings :  none   Exam: Sarah PortelaKim [facility] assistant Vitals:   11/22/15 0947  BP: 116/74  Weight: 129 lb (58.5 kg)  Height: 5' 3.5" (1.613 m)   Body mass index is 22.49 kg/m.  General appearance:  Normal affect, orientation and appearance. Skin: Grossly normal HEENT: Without gross lesions.  No cervical or supraclavicular adenopathy. Thyroid normal.  Lungs:  Clear without wheezing, rales or rhonchi Cardiac: RR, without RMG Abdominal:  Soft, nontender, without masses, guarding, rebound, organomegaly or hernia Breasts:  Examined lying and sitting without masses, retractions, discharge or axillary adenopathy. Pelvic:  Ext, BUS, Vagina with atrophic changes  Cervix with atrophic changes  Uterus anteverted, normal size, shape and contour, midline and mobile nontender   Adnexa without masses or tenderness    Anus and perineum normal   Rectovaginal normal sphincter tone without palpated masses or tenderness.    Assessment/Plan:  65 y.o. [id number] female for breast and pelvic exam .   1. Osteopenia.  DEXA 2016 T score -2.3 spine excluded due to degenerative changes. Status post bilateral hip replacements. I reviewed her bone density with her to include pictures and T-scores. The issue of only having her forearm to follow discussed. Whether treatment at this point with medication such as bisphosphate indicated versus continue observation for now maximizing calcium and vitamin D repeating her DEXA in another year. At this point patient prefers to wait. Will check vitamin D level today to make  sure that she is in the therapeutic range. 2. Mammography 09/2015. Continue with annual mammography when due. SBE monthly reviewed. 3. Colonoscopy 2015. Repeat at their recommended interval. 4. Pap smear 2016. No Pap smear done today. No history of significant abnormal Pap smears. 5. Health maintenance. No routine lab work done as this is done at her primary physician's office. Follow up 1 year, sooner as needed.  10 minutes of my time in excess of her breast and pelvic exam was spent in direct face to face counseling and coordination of care in regards to her osteopenia and treatment options.    Sarah LordsFONTAINE,Jabbar Palmero P MD, 10:07 AM 11/22/2015

## 2015-11-23 LAB — VITAMIN D 25 HYDROXY (VIT D DEFICIENCY, FRACTURES): Vit D, 25-Hydroxy: 63 ng/mL (ref 30–100)

## 2016-11-23 ENCOUNTER — Encounter: Payer: Medicare Other | Admitting: Gynecology

## 2016-12-30 ENCOUNTER — Encounter: Payer: Self-pay | Admitting: Gynecology

## 2017-01-18 ENCOUNTER — Encounter: Payer: Self-pay | Admitting: Gynecology

## 2017-02-17 ENCOUNTER — Telehealth: Payer: Self-pay | Admitting: *Deleted

## 2017-02-17 ENCOUNTER — Encounter: Payer: Self-pay | Admitting: Gynecology

## 2017-02-17 DIAGNOSIS — M8580 Other specified disorders of bone density and structure, unspecified site: Secondary | ICD-10-CM

## 2017-02-17 NOTE — Telephone Encounter (Signed)
Patient called requesting scheduling dexa, order placed, per note on 11/22/15.  Pt also due for annual exam, will have appointment desk call to schedule both.

## 2017-03-18 ENCOUNTER — Ambulatory Visit (INDEPENDENT_AMBULATORY_CARE_PROVIDER_SITE_OTHER): Payer: Medicare Other

## 2017-03-18 DIAGNOSIS — Z780 Asymptomatic menopausal state: Secondary | ICD-10-CM | POA: Diagnosis not present

## 2017-03-18 DIAGNOSIS — M8589 Other specified disorders of bone density and structure, multiple sites: Secondary | ICD-10-CM

## 2017-03-18 DIAGNOSIS — M8580 Other specified disorders of bone density and structure, unspecified site: Secondary | ICD-10-CM

## 2017-03-19 ENCOUNTER — Encounter: Payer: Self-pay | Admitting: Gynecology

## 2017-03-22 ENCOUNTER — Other Ambulatory Visit: Payer: Self-pay | Admitting: Gynecology

## 2017-03-22 ENCOUNTER — Telehealth: Payer: Self-pay | Admitting: Gynecology

## 2017-03-22 DIAGNOSIS — Z780 Asymptomatic menopausal state: Secondary | ICD-10-CM

## 2017-03-22 DIAGNOSIS — M8589 Other specified disorders of bone density and structure, multiple sites: Secondary | ICD-10-CM

## 2017-03-22 NOTE — Telephone Encounter (Signed)
Tell patient her bone density was of limited value given all we can measure is her wrist.  Lumbar spine shows significant degenerative changes which makes it hard to interpret.  She has had both hips therefore cannot use them as markers.  She did show an area of calcification as an artifact lateral to her spine on the left.  I think this probably was GI related as in something inside the intestine.  She had a recent MRI 11/2016 which looked in this area and saw no pathology reported.  2 options would be to go with the MRI showing no significant abnormalities in this area and stop there versus ordering a flat plate x-ray of the abdomen just to see if they can reproduce calcification.  I would leave the decision up to her as I feel either way would be fine.

## 2017-03-23 NOTE — Telephone Encounter (Signed)
Patient informed, she would rather proceed with x-ray of abdomen. Order will be placed.

## 2017-03-24 NOTE — Addendum Note (Signed)
Addended by: Aura CampsWEBB, Teliah Buffalo L on: 03/24/2017 04:17 PM   Modules accepted: Orders

## 2017-03-24 NOTE — Telephone Encounter (Signed)
Dr.Fontaine, what diagnosis to associate with x-ray?

## 2017-03-26 ENCOUNTER — Telehealth: Payer: Self-pay | Admitting: *Deleted

## 2017-03-26 DIAGNOSIS — M5186 Other intervertebral disc disorders, lumbar region: Secondary | ICD-10-CM

## 2017-03-26 NOTE — Addendum Note (Signed)
Addended by: Aura CampsWEBB, Kidus Delman L on: 03/26/2017 10:40 AM   Modules accepted: Orders

## 2017-03-26 NOTE — Telephone Encounter (Signed)
Dr.Fontaine I informed pt with dexa results:  "Tell patient her bone density was of limited value given all we can measure is her wrist.  Lumbar spine shows significant degenerative changes which makes it hard to interpret.  She has had both hips therefore cannot use them as markers.  She did show an area of calcification as an artifact lateral to her spine on the left.  I think this probably was GI related as in something inside the intestine.  She had a recent MRI 11/2016 which looked in this area and saw no pathology reported.  2 options would be to go with the MRI showing no significant abnormalities in this area and stop there versus ordering a flat plate x-ray of the abdomen just to see if they can reproduce calcification.  I would leave the decision up to her as I feel either way would be fine.   Pt would like to do X-ray abdomen, what diagnoses can I use to associated with x-ray?

## 2017-03-26 NOTE — Telephone Encounter (Signed)
Pt informed order placed can walk in anytime at [facility] between 8a-4pm , will come on Monday and fill out medical release form to send dexa to Dr.Stern as well.

## 2017-03-26 NOTE — Telephone Encounter (Signed)
Diagnosis abdominal calcification RE: abdominal calcification to the left of the lumbar spine seen on DEXA.  Questionable bowel artifact

## 2017-03-31 ENCOUNTER — Ambulatory Visit (HOSPITAL_COMMUNITY)
Admission: RE | Admit: 2017-03-31 | Discharge: 2017-03-31 | Disposition: A | Discharge status: Home / Self Care | Payer: Medicare Other | Source: Ambulatory Visit | Attending: Gynecology | Admitting: Gynecology

## 2017-03-31 ENCOUNTER — Ambulatory Visit (INDEPENDENT_AMBULATORY_CARE_PROVIDER_SITE_OTHER): Payer: Medicare Other | Admitting: Gynecology

## 2017-03-31 ENCOUNTER — Encounter: Payer: Self-pay | Admitting: Gynecology

## 2017-03-31 VITALS — BP 118/76 | Ht 63.0 in | Wt 142.0 lb

## 2017-03-31 DIAGNOSIS — Z01411 Encounter for gynecological examination (general) (routine) with abnormal findings: Secondary | ICD-10-CM | POA: Diagnosis not present

## 2017-03-31 DIAGNOSIS — M5186 Other intervertebral disc disorders, lumbar region: Secondary | ICD-10-CM | POA: Diagnosis present

## 2017-03-31 DIAGNOSIS — M8580 Other specified disorders of bone density and structure, unspecified site: Secondary | ICD-10-CM

## 2017-03-31 DIAGNOSIS — Z124 Encounter for screening for malignant neoplasm of cervix: Secondary | ICD-10-CM

## 2017-03-31 DIAGNOSIS — M8588 Other specified disorders of bone density and structure, other site: Secondary | ICD-10-CM

## 2017-03-31 DIAGNOSIS — N952 Postmenopausal atrophic vaginitis: Secondary | ICD-10-CM

## 2017-03-31 NOTE — Patient Instructions (Signed)
Follow-up in 1 year for annual exam, sooner if any issues. 

## 2017-03-31 NOTE — Progress Notes (Signed)
    Sarah FittingSandra W [facility] 02/19/1950 [id number]        67 y.o.  [id number] for pelvic exam.  Without gynecologic complaints.  Past medical history,surgical history, problem list, medications, allergies, family history and social history were all reviewed and documented as reviewed in the EPIC chart.  ROS:  Performed with pertinent positives and negatives included in the history, assessment and plan.   Additional significant findings : None   Exam: Kennon PortelaKim [facility] assistant Vitals:   03/31/17 0918  BP: 118/76  Weight: 142 lb (64.4 kg)  Height: 5\' 3"  (1.6 m)   Body mass index is 25.15 kg/m.  General appearance:  Normal affect, orientation and appearance. Skin: Grossly normal HEENT: Without gross lesions.  No cervical or supraclavicular adenopathy. Thyroid normal.  Lungs:  Clear without wheezing, rales or rhonchi Cardiac: RR, without RMG Abdominal:  Soft, nontender, without masses, guarding, rebound, organomegaly or hernia Breasts:  Examined lying and sitting without masses, retractions, discharge or axillary adenopathy. Pelvic:  Ext, BUS, Vagina: With atrophic changes  Cervix: With atrophic changes.  Pap smear done  Uterus: Anteverted, normal size, shape and contour, midline and mobile nontender   Adnexa: Without masses or tenderness    Anus and perineum: Normal   Rectovaginal: Normal sphincter tone without palpated masses or tenderness.    Assessment/Plan:  67 y.o. [id number] female for breast and pelvic exam.   1. Postmenopausal/atrophic genital changes.  No significant hot flushes, night sweats, vaginal dryness or any vaginal bleeding.  Continue to monitor and report any issues or bleeding. 2. Osteopenia.  Recent DEXA shows T score -2.1 distal third of the left radius.  Has had bilateral hip replacements.  Has had degenerative changes in her spine which makes interpretation difficult.  I reviewed with her at this point I would not recommend bone building medication based on her distal third  of the radius.  We will plan on repeating her study in several years.  She did show some calcifications within the abdominal cavity during the DEXA to the left of the lumbar spine.  She has a flat plate ordered today just to look at this area to see if it is a persistent abnormality.  We will further address with this report. 3. Mammography 01/2017.  Continue with annual mammography when due.  Breast exam normal today. 4. Colonoscopy 2015.  Repeat at their recommended interval. 5. Pap smear 2016.  Pap smear done today.  No history of abnormal Pap smears previously. 6. Health maintenance.  No routine lab work done as patient does this elsewhere.  Follow-up 1 year, sooner as needed.   Dara Lordsimothy P Norberta Stobaugh MD, 9:58 AM 03/31/2017

## 2017-03-31 NOTE — Addendum Note (Signed)
Addended by: Dayna BarkerGARDNER, Navarro Nine K on: 03/31/2017 11:06 AM   Modules accepted: Orders

## 2017-04-05 LAB — PAP IG W/ RFLX HPV ASCU

## 2017-06-28 ENCOUNTER — Other Ambulatory Visit: Payer: Self-pay | Admitting: Orthopedic Surgery

## 2017-06-29 ENCOUNTER — Other Ambulatory Visit: Payer: Self-pay | Admitting: Orthopedic Surgery

## 2017-06-29 DIAGNOSIS — M19211 Secondary osteoarthritis, right shoulder: Secondary | ICD-10-CM

## 2017-07-02 ENCOUNTER — Ambulatory Visit
Admission: RE | Admit: 2017-07-02 | Discharge: 2017-07-02 | Disposition: A | Discharge status: Home / Self Care | Payer: Medicare Other | Source: Ambulatory Visit | Attending: Orthopedic Surgery | Admitting: Orthopedic Surgery

## 2017-07-02 DIAGNOSIS — M19211 Secondary osteoarthritis, right shoulder: Secondary | ICD-10-CM

## 2017-07-14 NOTE — Pre-Procedure Instructions (Signed)
[facility]  07/14/2017      [id number] - [place], [place] - [address] [place] [address] Phone: [phone] Fax: [phone]    Your procedure is scheduled on Mon. June 6  Report to [facility] Admitting at 7:45 A.M.  Call this number if you have problems the morning of surgery:  [phone]   Remember:   No food or drinks after midnight.                        Take these medicines the morning of surgery with A SIP OF WATER : gabapentin (neurontin), levothyroxine (synthroid) paroxetine (paxil) maxalt if needed,                 7 days prior to surgery STOP taking any Aspirin(unless otherwise instructed by your surgeon), Aleve, Naproxen, Ibuprofen, Motrin, Advil, Goody's, BC's, all herbal medications, fish oil, and all vitamins      Do not wear jewelry, make-up or nail polish.  Do not wear lotions, powders, or perfumes, or deodorant.  Do not shave 48 hours prior to surgery.  Men may shave face and neck.  Do not bring valuables to the hospital.  [facility] is not responsible for any belongings or valuables.  Contacts, dentures or bridgework may not be worn into surgery.  Leave your suitcase in the car.  After surgery it may be brought to your room.  For patients admitted to the hospital, discharge time will be determined by your treatment team.  Patients discharged the day of surgery will not be allowed to drive home.    Special instructions:  [facility]- Preparing For Surgery  Before surgery, you can play an important role. Because skin is not sterile, your skin needs to be as free of germs as possible. You can reduce the number of germs on your skin by washing with CHG (chlorahexidine gluconate) Soap before surgery.  CHG is an antiseptic cleaner which kills germs and bonds with the skin to continue killing germs even after washing.    Oral Hygiene is also important to reduce your risk  of infection.  Remember - BRUSH YOUR TEETH THE MORNING OF SURGERY WITH YOUR REGULAR TOOTHPASTE  Please do not use if you have an allergy to CHG or antibacterial soaps. If your skin becomes reddened/irritated stop using the CHG.  Do not shave (including legs and underarms) for at least 48 hours prior to first CHG shower. It is OK to shave your face.  Please follow these instructions carefully.   1. Shower the NIGHT BEFORE SURGERY and the MORNING OF SURGERY with CHG.   2. If you chose to wash your hair, wash your hair first as usual with your normal shampoo.  3. After you shampoo, rinse your hair and body thoroughly to remove the shampoo.  4. Use CHG as you would any other liquid soap. You can apply CHG directly to the skin and wash gently with a scrungie or a clean washcloth.   5. Apply the CHG Soap to your body ONLY FROM THE NECK DOWN.  Do not use on open wounds or open sores. Avoid contact with your eyes, ears, mouth and genitals (private parts). Wash Face and genitals (private parts)  with your normal soap.  6. Wash thoroughly, paying special attention to the area where your surgery will be performed.  7.  Thoroughly rinse your body with warm water from the neck down.  8. DO NOT shower/wash with your normal soap after using and rinsing off the CHG Soap.  9. Pat yourself dry with a CLEAN TOWEL.  10. Wear CLEAN PAJAMAS to bed the night before surgery, wear comfortable clothes the morning of surgery  11. Place CLEAN SHEETS on your bed the night of your first shower and DO NOT SLEEP WITH PETS.    Day of Surgery:  Do not apply any deodorants/lotions.  Please wear clean clothes to the hospital/surgery center.   Remember to brush your teeth WITH YOUR REGULAR TOOTHPASTE.    Please read over the following fact sheets that you were given. Coughing and Deep Breathing, MRSA Information and Surgical Site Infection Prevention

## 2017-07-15 ENCOUNTER — Encounter (HOSPITAL_COMMUNITY)
Admission: RE | Admit: 2017-07-15 | Discharge: 2017-07-15 | Disposition: A | Discharge status: Home / Self Care | Payer: Medicare Other | Source: Ambulatory Visit | Attending: Orthopedic Surgery | Admitting: Orthopedic Surgery

## 2017-07-15 ENCOUNTER — Encounter (HOSPITAL_COMMUNITY): Payer: Self-pay

## 2017-07-15 ENCOUNTER — Ambulatory Visit (HOSPITAL_COMMUNITY)
Admission: RE | Admit: 2017-07-15 | Discharge: 2017-07-15 | Disposition: A | Discharge status: Home / Self Care | Payer: Medicare Other | Source: Ambulatory Visit | Attending: Orthopedic Surgery | Admitting: Orthopedic Surgery

## 2017-07-15 DIAGNOSIS — Z01818 Encounter for other preprocedural examination: Secondary | ICD-10-CM

## 2017-07-15 DIAGNOSIS — M19011 Primary osteoarthritis, right shoulder: Secondary | ICD-10-CM | POA: Insufficient documentation

## 2017-07-15 HISTORY — DX: Gastro-esophageal reflux disease without esophagitis: K21.9

## 2017-07-15 HISTORY — DX: Transient cerebral ischemic attack, unspecified: G45.9

## 2017-07-15 HISTORY — DX: Depression, unspecified: F32.A

## 2017-07-15 HISTORY — DX: Anxiety disorder, unspecified: F41.9

## 2017-07-15 HISTORY — DX: Headache, unspecified: R51.9

## 2017-07-15 HISTORY — DX: Headache: R51

## 2017-07-15 HISTORY — DX: Family history of other specified conditions: Z84.89

## 2017-07-15 HISTORY — DX: Major depressive disorder, single episode, unspecified: F32.9

## 2017-07-15 HISTORY — DX: Other specified postprocedural states: Z98.890

## 2017-07-15 HISTORY — DX: Pneumonia, unspecified organism: J18.9

## 2017-07-15 HISTORY — DX: Nausea with vomiting, unspecified: R11.2

## 2017-07-15 LAB — CBC WITH DIFFERENTIAL/PLATELET
ABS IMMATURE GRANULOCYTES: 0 10*3/uL (ref 0.0–0.1)
BASOS ABS: 0.1 10*3/uL (ref 0.0–0.1)
BASOS PCT: 1 %
Eosinophils Absolute: 0.4 10*3/uL (ref 0.0–0.7)
Eosinophils Relative: 4 %
HCT: 41.2 % (ref 36.0–46.0)
Hemoglobin: 13.2 g/dL (ref 12.0–15.0)
IMMATURE GRANULOCYTES: 0 %
Lymphocytes Relative: 32 %
Lymphs Abs: 3.1 10*3/uL (ref 0.7–4.0)
MCH: 30.2 pg (ref 26.0–34.0)
MCHC: 32 g/dL (ref 30.0–36.0)
MCV: 94.3 fL (ref 78.0–100.0)
Monocytes Absolute: 0.8 10*3/uL (ref 0.1–1.0)
Monocytes Relative: 8 %
NEUTROS PCT: 55 %
Neutro Abs: 5.2 10*3/uL (ref 1.7–7.7)
PLATELETS: 402 10*3/uL — AB (ref 150–400)
RBC: 4.37 MIL/uL (ref 3.87–5.11)
RDW: 12.7 % (ref 11.5–15.5)
WBC: 9.6 10*3/uL (ref 4.0–10.5)

## 2017-07-15 LAB — COMPREHENSIVE METABOLIC PANEL
ALT: 31 U/L (ref 14–54)
AST: 30 U/L (ref 15–41)
Albumin: 4.1 g/dL (ref 3.5–5.0)
Alkaline Phosphatase: 90 U/L (ref 38–126)
Anion gap: 9 (ref 5–15)
BUN: 15 mg/dL (ref 6–20)
CHLORIDE: 104 mmol/L (ref 101–111)
CO2: 28 mmol/L (ref 22–32)
Calcium: 10.2 mg/dL (ref 8.9–10.3)
Creatinine, Ser: 0.76 mg/dL (ref 0.44–1.00)
GFR calc Af Amer: >60 mL/min (ref >60)
GFR calc non Af Amer: >60 mL/min (ref >60)
Glucose, Bld: 102 mg/dL — ABNORMAL HIGH (ref 65–99)
Potassium: 3.8 mmol/L (ref 3.5–5.1)
SODIUM: 141 mmol/L (ref 135–145)
Total Bilirubin: 0.6 mg/dL (ref 0.3–1.2)
Total Protein: 7.1 g/dL (ref 6.5–8.1)

## 2017-07-15 LAB — PROTIME-INR
INR: 0.85
Prothrombin Time: 11.5 seconds (ref 11.4–15.2)

## 2017-07-15 LAB — URINALYSIS, ROUTINE W REFLEX MICROSCOPIC
BILIRUBIN URINE: NEGATIVE
GLUCOSE, UA: NEGATIVE mg/dL
HGB URINE DIPSTICK: NEGATIVE
KETONES UR: NEGATIVE mg/dL
Leukocytes, UA: NEGATIVE
Nitrite: NEGATIVE
PROTEIN: NEGATIVE mg/dL
Specific Gravity, Urine: 1.011 (ref 1.005–1.030)
pH: 8 (ref 5.0–8.0)

## 2017-07-15 LAB — TYPE AND SCREEN
ABO/RH(D): B POS
Antibody Screen: NEGATIVE

## 2017-07-15 LAB — APTT: APTT: 26 s (ref 24–36)

## 2017-07-15 LAB — SURGICAL PCR SCREEN
MRSA, PCR: NEGATIVE
STAPHYLOCOCCUS AUREUS: NEGATIVE

## 2017-07-15 NOTE — Progress Notes (Signed)
PCP: Dr. Derek Jack Neurologist: Dr. Loletta Parish Cardiologist: [facility], Dr. Marisa Cyphers

## 2017-07-16 NOTE — Progress Notes (Signed)
Anesthesia Chart Review:   Case:  [id number] Date/Time:  07/22/17 0930   Procedure:  RIGHT TOTAL SHOULDER ARTHROPLASTY (Right Shoulder)   Anesthesia type:  Choice   Pre-op diagnosis:      RIGHT SHOULDER OSTEOARTHRITIS     M19.211   Location:  [facility] ROOM 07 / [facility]   Surgeon:  Jones Broom, MD      DISCUSSION: - Pt is a 67 year old female   VS: BP (!) 141/69   Pulse 68   Temp 36.8 C   Resp 18   Ht  (1.6 m)   Wt 137 lb 12.8 oz (62.5 kg)   SpO2 98%   BMI 24.41 kg/m     PROVIDERS: PCP is Cheral Bay, MD    LABS: Labs reviewed: Acceptable for surgery. (all labs ordered are listed, but only abnormal results are displayed)  Labs Reviewed  CBC WITH DIFFERENTIAL/PLATELET - Abnormal; Notable for the following components:      Result Value   Platelets 402 (*)    All other components within normal limits  COMPREHENSIVE METABOLIC PANEL - Abnormal; Notable for the following components:   Glucose, Bld 102 (*)    All other components within normal limits  URINALYSIS, ROUTINE W REFLEX MICROSCOPIC - Abnormal; Notable for the following components:   APPearance CLOUDY (*)    All other components within normal limits  SURGICAL PCR SCREEN  APTT  PROTIME-INR  TYPE AND SCREEN     IMAGES:  CXR 07/15/17: No active cardiopulmonary disease   EKG 08/28/16 [facility] [place] [facility]): NSR   CV:  Echo 09/15/2016 [facility] cardiology [facility]): 1. Normal LV size and systolic function. EF 55-60% 2. Trace tricuspid regurgitation  Cardiac event monitor 09/15/17 [facility]):  1. Sinus rhythm 2. Rare PVCs  Cardiac cath 09/13/2012 ([facility]): 1.  Mild CAD (proximal LAD 20%; RCA mild irregularity. 2.  Mild dilatation of mid RCA and LAD. 3.  Normal LV function.   Past Medical History:  Diagnosis Date  . Anemia    history of  . Anxiety   . Arthritis   . Depression   . Elevated cholesterol   . Endometrial polyp   . Family history of adverse  reaction to anesthesia    sister has nausea/vomiting  . GERD (gastroesophageal reflux disease)    occasionally  . Headache   . 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 Osteopenia 02/2017   T score -2.1 distal third left forearm  . Pneumonia   . PONV (postoperative nausea and vomiting)   . TIA (transient ischemic attack) fall 2018   states it was possible    Past Surgical History:  Procedure Laterality Date  . abdominal lipoma    . APPENDECTOMY    . BREAST SURGERY  2011   radial scar removed  . DILATION AND CURETTAGE OF UTERUS    . HYSTEROSCOPY    . NASAL SINUS SURGERY    . NOSE SURGERY     plastic surgery  . SHOULDER SURGERY    . TONSILLECTOMY    . TOTAL HIP ARTHROPLASTY  2008-2012   Bilateral  . TOTAL HIP REVISION    . TUBAL LIGATION      MEDICATIONS: . HYDROcodone-acetaminophen (NORCO/VICODIN) 5-325 MG tablet  . Cholecalciferol (VITAMIN D) 2000 units tablet  . Coenzyme Q10 (COQ10) 100 MG CAPS  . Echinacea 125 MG CAPS  . ezetimibe (ZETIA) 10 MG tablet  .  Ferrous Gluconate-C-Folic Acid (IRON-C PO)  . gabapentin (NEURONTIN) 300 MG capsule  . levothyroxine (SYNTHROID, LEVOTHROID) 75 MCG tablet  . omeprazole (PRILOSEC) 40 MG capsule  . PARoxetine (PAXIL) 20 MG tablet  . potassium chloride (K-DUR,KLOR-CON) 10 MEQ tablet  . rizatriptan (MAXALT) 10 MG tablet  . temazepam (RESTORIL) 30 MG capsule  . triamterene-hydrochlorothiazide (MAXZIDE-25) 37.5-25 MG per tablet  . vitamin C (ASCORBIC ACID) 500 MG tablet  . zinc gluconate 50 MG tablet   No current facility-administered medications for this encounter.     If no changes, I anticipate pt can proceed with surgery as scheduled.   Rica Mast, FNP-BC [facility]/Anesthesiology Phone: [phone] 07/21/2017 9:33 AM

## 2017-07-20 ENCOUNTER — Other Ambulatory Visit: Payer: Self-pay | Admitting: Orthopedic Surgery

## 2017-07-20 NOTE — Care Plan (Signed)
Spoke with patient prior to surgery. Will discharge to home with husband. Questions answered. No DME or HH needs at this time. WIll transition to OPPT at 6 week point or as directed by physician.   Please contact Shauna HughRenee Angiulli, RNCM [phone] should you have questions or if this plan should need to change   Thanks

## 2017-07-21 MED ORDER — CEFAZOLIN SODIUM-DEXTROSE 2-4 GM/100ML-% IV SOLN
2.0000 g | INTRAVENOUS | Status: AC
  Administered 2017-07-22: 2 g via INTRAVENOUS
  Filled 2017-07-21: qty 100

## 2017-07-21 MED ORDER — TRANEXAMIC ACID 1000 MG/10ML IV SOLN
1000.0000 mg | INTRAVENOUS | Status: AC
  Administered 2017-07-22: 1000 mg via INTRAVENOUS
  Filled 2017-07-21: qty 1100
  Filled 2017-07-21: qty 10

## 2017-07-22 ENCOUNTER — Encounter (HOSPITAL_COMMUNITY): Payer: Self-pay | Admitting: *Deleted

## 2017-07-22 ENCOUNTER — Inpatient Hospital Stay (HOSPITAL_COMMUNITY): Payer: Medicare Other

## 2017-07-22 ENCOUNTER — Inpatient Hospital Stay (HOSPITAL_COMMUNITY)
Admission: RE | Admit: 2017-07-22 | Discharge: 2017-07-23 | DRG: 483 | Disposition: A | Discharge status: Home / Self Care | Payer: Medicare Other | Attending: Orthopedic Surgery | Admitting: Orthopedic Surgery

## 2017-07-22 ENCOUNTER — Encounter (HOSPITAL_COMMUNITY)
Admission: RE | Disposition: A | Discharge status: Home / Self Care | Payer: Self-pay | Source: Home / Self Care | Attending: Orthopedic Surgery

## 2017-07-22 ENCOUNTER — Inpatient Hospital Stay (HOSPITAL_COMMUNITY): Payer: Medicare Other | Admitting: Emergency Medicine

## 2017-07-22 ENCOUNTER — Inpatient Hospital Stay (HOSPITAL_COMMUNITY): Payer: Medicare Other | Admitting: Certified Registered Nurse Anesthetist

## 2017-07-22 DIAGNOSIS — M8580 Other specified disorders of bone density and structure, unspecified site: Secondary | ICD-10-CM | POA: Diagnosis present

## 2017-07-22 DIAGNOSIS — K219 Gastro-esophageal reflux disease without esophagitis: Secondary | ICD-10-CM | POA: Diagnosis present

## 2017-07-22 DIAGNOSIS — Z79899 Other long term (current) drug therapy: Secondary | ICD-10-CM

## 2017-07-22 DIAGNOSIS — Z91018 Allergy to other foods: Secondary | ICD-10-CM | POA: Diagnosis not present

## 2017-07-22 DIAGNOSIS — D649 Anemia, unspecified: Secondary | ICD-10-CM | POA: Diagnosis present

## 2017-07-22 DIAGNOSIS — E876 Hypokalemia: Secondary | ICD-10-CM | POA: Diagnosis present

## 2017-07-22 DIAGNOSIS — Z885 Allergy status to narcotic agent status: Secondary | ICD-10-CM | POA: Diagnosis not present

## 2017-07-22 DIAGNOSIS — M19011 Primary osteoarthritis, right shoulder: Principal | ICD-10-CM | POA: Diagnosis present

## 2017-07-22 DIAGNOSIS — Z96643 Presence of artificial hip joint, bilateral: Secondary | ICD-10-CM | POA: Diagnosis present

## 2017-07-22 DIAGNOSIS — Z8673 Personal history of transient ischemic attack (TIA), and cerebral infarction without residual deficits: Secondary | ICD-10-CM

## 2017-07-22 DIAGNOSIS — Z96611 Presence of right artificial shoulder joint: Secondary | ICD-10-CM

## 2017-07-22 HISTORY — PX: TOTAL SHOULDER ARTHROPLASTY: SHX126

## 2017-07-22 SURGERY — ARTHROPLASTY, SHOULDER, TOTAL
Anesthesia: General | Site: Shoulder | Laterality: Right

## 2017-07-22 MED ORDER — METHOCARBAMOL 1000 MG/10ML IJ SOLN
500.0000 mg | Freq: Four times a day (QID) | INTRAVENOUS | Status: DC | PRN
  Filled 2017-07-22: qty 5

## 2017-07-22 MED ORDER — SENNOSIDES-DOCUSATE SODIUM 8.6-50 MG PO TABS: 1.0000 | ORAL_TABLET | Freq: Every evening | ORAL | Status: DC | PRN

## 2017-07-22 MED ORDER — BUPIVACAINE LIPOSOME 1.3 % IJ SUSP
INTRAMUSCULAR | Status: DC | PRN
  Administered 2017-07-22: 15 mL via PERINEURAL

## 2017-07-22 MED ORDER — FENTANYL CITRATE (PF) 250 MCG/5ML IJ SOLN
INTRAMUSCULAR | Status: AC
  Filled 2017-07-22: qty 5

## 2017-07-22 MED ORDER — DOCUSATE SODIUM 100 MG PO CAPS
100.0000 mg | ORAL_CAPSULE | Freq: Two times a day (BID) | ORAL | Status: DC
  Administered 2017-07-22 – 2017-07-23 (×2): 100 mg via ORAL
  Filled 2017-07-22 (×2): qty 1

## 2017-07-22 MED ORDER — EZETIMIBE 10 MG PO TABS
10.0000 mg | ORAL_TABLET | Freq: Every day | ORAL | Status: DC
  Administered 2017-07-23: 10 mg via ORAL
  Filled 2017-07-22: qty 1

## 2017-07-22 MED ORDER — 0.9 % SODIUM CHLORIDE (POUR BTL) OPTIME
TOPICAL | Status: DC | PRN
  Administered 2017-07-22: 1000 mL

## 2017-07-22 MED ORDER — FENTANYL CITRATE (PF) 250 MCG/5ML IJ SOLN
INTRAMUSCULAR | Status: DC | PRN
  Administered 2017-07-22: 50 ug via INTRAVENOUS

## 2017-07-22 MED ORDER — OXYCODONE HCL 5 MG/5ML PO SOLN: 5.0000 mg | Freq: Once | ORAL | Status: DC | PRN

## 2017-07-22 MED ORDER — OXYCODONE HCL 5 MG PO TABS: 5.0000 mg | ORAL_TABLET | Freq: Once | ORAL | Status: DC | PRN

## 2017-07-22 MED ORDER — DIPHENHYDRAMINE HCL 12.5 MG/5ML PO ELIX: 12.5000 mg | ORAL_SOLUTION | ORAL | Status: DC | PRN

## 2017-07-22 MED ORDER — ONDANSETRON HCL 4 MG PO TABS: 4.0000 mg | ORAL_TABLET | Freq: Four times a day (QID) | ORAL | Status: DC | PRN

## 2017-07-22 MED ORDER — LIP MEDEX EX OINT
TOPICAL_OINTMENT | CUTANEOUS | Status: DC | PRN
  Filled 2017-07-22: qty 7

## 2017-07-22 MED ORDER — MIDAZOLAM HCL 2 MG/2ML IJ SOLN
1.0000 mg | INTRAMUSCULAR | Status: DC | PRN
  Administered 2017-07-22: 2 mg via INTRAVENOUS

## 2017-07-22 MED ORDER — PHENYLEPHRINE HCL 10 MG/ML IJ SOLN
INTRAMUSCULAR | Status: DC | PRN
  Administered 2017-07-22 (×2): 80 ug via INTRAVENOUS

## 2017-07-22 MED ORDER — ONDANSETRON HCL 4 MG/2ML IJ SOLN
INTRAMUSCULAR | Status: DC | PRN
  Administered 2017-07-22: 4 mg via INTRAVENOUS

## 2017-07-22 MED ORDER — CEFAZOLIN SODIUM-DEXTROSE 1-4 GM/50ML-% IV SOLN
1.0000 g | Freq: Four times a day (QID) | INTRAVENOUS | Status: AC
  Administered 2017-07-22 – 2017-07-23 (×3): 1 g via INTRAVENOUS
  Filled 2017-07-22 (×3): qty 50

## 2017-07-22 MED ORDER — TRIAMTERENE-HCTZ 37.5-25 MG PO TABS
1.0000 | ORAL_TABLET | Freq: Two times a day (BID) | ORAL | Status: DC
Start: 2017-07-22 — End: 2017-07-23
  Administered 2017-07-22 – 2017-07-23 (×2): 1 via ORAL
  Filled 2017-07-22 (×2): qty 1

## 2017-07-22 MED ORDER — FENTANYL CITRATE (PF) 100 MCG/2ML IJ SOLN
INTRAMUSCULAR | Status: AC
  Administered 2017-07-22: 50 ug via INTRAVENOUS
  Filled 2017-07-22: qty 2

## 2017-07-22 MED ORDER — FENTANYL CITRATE (PF) 100 MCG/2ML IJ SOLN: 25.0000 ug | INTRAMUSCULAR | Status: DC | PRN

## 2017-07-22 MED ORDER — ALUMINUM HYDROXIDE GEL 320 MG/5ML PO SUSP
15.0000 mL | ORAL | Status: DC | PRN
  Filled 2017-07-22: qty 30

## 2017-07-22 MED ORDER — BUPIVACAINE HCL (PF) 0.5 % IJ SOLN
INTRAMUSCULAR | Status: DC | PRN
  Administered 2017-07-22: 15 mL via PERINEURAL

## 2017-07-22 MED ORDER — FLEET ENEMA 7-19 GM/118ML RE ENEM: 1.0000 | ENEMA | Freq: Once | RECTAL | Status: DC | PRN

## 2017-07-22 MED ORDER — HYDROMORPHONE HCL 2 MG/ML IJ SOLN: 0.5000 mg | INTRAMUSCULAR | Status: DC | PRN

## 2017-07-22 MED ORDER — POVIDONE-IODINE 7.5 % EX SOLN
Freq: Once | CUTANEOUS | Status: DC
  Filled 2017-07-22: qty 118

## 2017-07-22 MED ORDER — DEXAMETHASONE SODIUM PHOSPHATE 4 MG/ML IJ SOLN
INTRAMUSCULAR | Status: DC | PRN
  Administered 2017-07-22: 10 mg via INTRAVENOUS

## 2017-07-22 MED ORDER — METHOCARBAMOL 500 MG PO TABS: 500.0000 mg | ORAL_TABLET | Freq: Three times a day (TID) | ORAL | 0 refills | Status: AC

## 2017-07-22 MED ORDER — ONDANSETRON HCL 4 MG/2ML IJ SOLN: 4.0000 mg | Freq: Once | INTRAMUSCULAR | Status: DC | PRN

## 2017-07-22 MED ORDER — OXYCODONE HCL 5 MG PO TABS
10.0000 mg | ORAL_TABLET | ORAL | Status: DC | PRN
  Administered 2017-07-22 – 2017-07-23 (×2): 10 mg via ORAL

## 2017-07-22 MED ORDER — OXYCODONE HCL 5 MG PO TABS
5.0000 mg | ORAL_TABLET | ORAL | Status: DC | PRN
  Filled 2017-07-22 (×2): qty 2

## 2017-07-22 MED ORDER — DEXTROSE 5 % IV SOLN
INTRAVENOUS | Status: DC | PRN
  Administered 2017-07-22: 35 ug/min via INTRAVENOUS

## 2017-07-22 MED ORDER — PANTOPRAZOLE SODIUM 40 MG PO TBEC
80.0000 mg | DELAYED_RELEASE_TABLET | Freq: Every day | ORAL | Status: DC
  Administered 2017-07-22 – 2017-07-23 (×2): 80 mg via ORAL
  Filled 2017-07-22 (×2): qty 2

## 2017-07-22 MED ORDER — ASPIRIN EC 81 MG PO TBEC
81.0000 mg | DELAYED_RELEASE_TABLET | Freq: Two times a day (BID) | ORAL | Status: DC
  Administered 2017-07-22 – 2017-07-23 (×2): 81 mg via ORAL
  Filled 2017-07-22 (×2): qty 1

## 2017-07-22 MED ORDER — METOCLOPRAMIDE HCL 5 MG/ML IJ SOLN: 5.0000 mg | Freq: Three times a day (TID) | INTRAMUSCULAR | Status: DC | PRN

## 2017-07-22 MED ORDER — SODIUM CHLORIDE 0.9 % IV SOLN
INTRAVENOUS | Status: DC
  Administered 2017-07-22: 19:00:00 via INTRAVENOUS

## 2017-07-22 MED ORDER — PROPOFOL 10 MG/ML IV BOLUS
INTRAVENOUS | Status: DC | PRN
  Administered 2017-07-22: 150 mg via INTRAVENOUS
  Administered 2017-07-22: 50 mg via INTRAVENOUS

## 2017-07-22 MED ORDER — DOCUSATE SODIUM 100 MG PO CAPS: 100.0000 mg | ORAL_CAPSULE | Freq: Three times a day (TID) | ORAL | 0 refills | Status: AC | PRN

## 2017-07-22 MED ORDER — LACTATED RINGERS IV SOLN
INTRAVENOUS | Status: DC
  Administered 2017-07-22 (×2): via INTRAVENOUS

## 2017-07-22 MED ORDER — ONDANSETRON HCL 4 MG/2ML IJ SOLN: 4.0000 mg | Freq: Four times a day (QID) | INTRAMUSCULAR | Status: DC | PRN

## 2017-07-22 MED ORDER — ZOLPIDEM TARTRATE 5 MG PO TABS
5.0000 mg | ORAL_TABLET | Freq: Every evening | ORAL | Status: DC | PRN
  Filled 2017-07-22: qty 1

## 2017-07-22 MED ORDER — MIDAZOLAM HCL 2 MG/2ML IJ SOLN
INTRAMUSCULAR | Status: AC
  Administered 2017-07-22: 2 mg via INTRAVENOUS
  Filled 2017-07-22: qty 2

## 2017-07-22 MED ORDER — METHOCARBAMOL 500 MG PO TABS
500.0000 mg | ORAL_TABLET | Freq: Four times a day (QID) | ORAL | Status: DC | PRN
  Administered 2017-07-22 – 2017-07-23 (×2): 500 mg via ORAL
  Filled 2017-07-22 (×2): qty 1

## 2017-07-22 MED ORDER — ACETAMINOPHEN 160 MG/5ML PO SOLN: 325.0000 mg | ORAL | Status: DC | PRN

## 2017-07-22 MED ORDER — OXYCODONE-ACETAMINOPHEN 5-325 MG PO TABS: 1.0000 | ORAL_TABLET | ORAL | 0 refills | Status: DC | PRN

## 2017-07-22 MED ORDER — SUMATRIPTAN SUCCINATE 50 MG PO TABS
50.0000 mg | ORAL_TABLET | ORAL | Status: DC | PRN
  Filled 2017-07-22: qty 1

## 2017-07-22 MED ORDER — FENTANYL CITRATE (PF) 100 MCG/2ML IJ SOLN
50.0000 ug | INTRAMUSCULAR | Status: DC | PRN
  Administered 2017-07-22: 50 ug via INTRAVENOUS

## 2017-07-22 MED ORDER — MEPERIDINE HCL 50 MG/ML IJ SOLN: 6.2500 mg | INTRAMUSCULAR | Status: DC | PRN

## 2017-07-22 MED ORDER — SODIUM CHLORIDE 0.9 % IR SOLN
Status: DC | PRN
  Administered 2017-07-22: 3000 mL

## 2017-07-22 MED ORDER — METOCLOPRAMIDE HCL 5 MG PO TABS: 5.0000 mg | ORAL_TABLET | Freq: Three times a day (TID) | ORAL | Status: DC | PRN

## 2017-07-22 MED ORDER — BISACODYL 5 MG PO TBEC: 5.0000 mg | DELAYED_RELEASE_TABLET | Freq: Every day | ORAL | Status: DC | PRN

## 2017-07-22 MED ORDER — PAROXETINE HCL 20 MG PO TABS: 20.0000 mg | ORAL_TABLET | Freq: Every day | ORAL | Status: DC

## 2017-07-22 MED ORDER — GABAPENTIN 300 MG PO CAPS
600.0000 mg | ORAL_CAPSULE | Freq: Two times a day (BID) | ORAL | Status: DC
  Administered 2017-07-22 – 2017-07-23 (×2): 600 mg via ORAL
  Filled 2017-07-22 (×2): qty 2

## 2017-07-22 MED ORDER — ACETAMINOPHEN 325 MG PO TABS: 325.0000 mg | ORAL_TABLET | ORAL | Status: DC | PRN

## 2017-07-22 MED ORDER — MENTHOL 3 MG MT LOZG: 1.0000 | LOZENGE | OROMUCOSAL | Status: DC | PRN

## 2017-07-22 MED ORDER — ACETAMINOPHEN 325 MG PO TABS: 325.0000 mg | ORAL_TABLET | Freq: Four times a day (QID) | ORAL | Status: DC | PRN

## 2017-07-22 MED ORDER — PROPOFOL 10 MG/ML IV BOLUS
INTRAVENOUS | Status: AC
  Filled 2017-07-22: qty 20

## 2017-07-22 MED ORDER — PHENOL 1.4 % MT LIQD: 1.0000 | OROMUCOSAL | Status: DC | PRN

## 2017-07-22 MED ORDER — SUGAMMADEX SODIUM 200 MG/2ML IV SOLN
INTRAVENOUS | Status: DC | PRN
  Administered 2017-07-22: 125 mg via INTRAVENOUS

## 2017-07-22 MED ORDER — LIDOCAINE HCL (CARDIAC) PF 100 MG/5ML IV SOSY
PREFILLED_SYRINGE | INTRAVENOUS | Status: DC | PRN
  Administered 2017-07-22: 100 mg via INTRAVENOUS

## 2017-07-22 MED ORDER — ACETAMINOPHEN 500 MG PO TABS
1000.0000 mg | ORAL_TABLET | Freq: Four times a day (QID) | ORAL | Status: DC
  Administered 2017-07-22 – 2017-07-23 (×2): 1000 mg via ORAL
  Filled 2017-07-22 (×2): qty 2

## 2017-07-22 MED ORDER — POTASSIUM CHLORIDE CRYS ER 20 MEQ PO TBCR
20.0000 meq | EXTENDED_RELEASE_TABLET | Freq: Two times a day (BID) | ORAL | Status: DC
  Administered 2017-07-22 – 2017-07-23 (×2): 20 meq via ORAL
  Filled 2017-07-22 (×2): qty 1

## 2017-07-22 MED ORDER — TEMAZEPAM 15 MG PO CAPS
30.0000 mg | ORAL_CAPSULE | Freq: Every day | ORAL | Status: DC
  Administered 2017-07-22: 30 mg via ORAL
  Filled 2017-07-22: qty 2

## 2017-07-22 MED ORDER — LEVOTHYROXINE SODIUM 75 MCG PO TABS
75.0000 ug | ORAL_TABLET | Freq: Every day | ORAL | Status: DC
  Administered 2017-07-23: 75 ug via ORAL
  Filled 2017-07-22: qty 1

## 2017-07-22 SURGICAL SUPPLY — 73 items
AID PSTN UNV HD RSTRNT DISP (MISCELLANEOUS) ×1
BIT DRILL 5/64X5 DISP (BIT) ×3 IMPLANT
BLADE SAW SAG 73X25 THK (BLADE) ×2
BLADE SAW SGTL 73X25 THK (BLADE) ×1 IMPLANT
BLADE SURG 15 STRL LF DISP TIS (BLADE) ×1 IMPLANT
BLADE SURG 15 STRL SS (BLADE) ×3
CEMENT BONE DEPUY (Cement) ×2 IMPLANT
CHLORAPREP W/TINT 26ML (MISCELLANEOUS) ×3 IMPLANT
CLOSURE STERI-STRIP 1/2X4 (GAUZE/BANDAGES/DRESSINGS) ×1
CLOSURE WOUND 1/2 X4 (GAUZE/BANDAGES/DRESSINGS) ×1
CLSR STERI-STRIP ANTIMIC 1/2X4 (GAUZE/BANDAGES/DRESSINGS) ×1 IMPLANT
COVER SURGICAL LIGHT HANDLE (MISCELLANEOUS) ×3 IMPLANT
DRAPE INCISE IOBAN 66X45 STRL (DRAPES) ×3 IMPLANT
DRAPE ORTHO SPLIT 77X108 STRL (DRAPES) ×6
DRAPE SURG 17X23 STRL (DRAPES) ×3 IMPLANT
DRAPE SURG ORHT 6 SPLT 77X108 (DRAPES) ×2 IMPLANT
DRAPE U-SHAPE 47X51 STRL (DRAPES) ×3 IMPLANT
DRSG AQUACEL AG ADV 3.5X 6 (GAUZE/BANDAGES/DRESSINGS) ×2 IMPLANT
DRSG AQUACEL AG ADV 3.5X10 (GAUZE/BANDAGES/DRESSINGS) IMPLANT
ELECT BLADE 4.0 EZ CLEAN MEGAD (MISCELLANEOUS)
ELECT REM PT RETURN 9FT ADLT (ELECTROSURGICAL) ×3
ELECTRODE BLDE 4.0 EZ CLN MEGD (MISCELLANEOUS) IMPLANT
ELECTRODE REM PT RTRN 9FT ADLT (ELECTROSURGICAL) ×1 IMPLANT
GLENOID PEGGED CORTILOC M40 (Orthopedic Implant) IMPLANT
GLOVE BIO SURGEON STRL SZ7 (GLOVE) ×3 IMPLANT
GLOVE BIO SURGEON STRL SZ7.5 (GLOVE) ×3 IMPLANT
GLOVE BIOGEL PI IND STRL 7.0 (GLOVE) ×1 IMPLANT
GLOVE BIOGEL PI IND STRL 8 (GLOVE) ×1 IMPLANT
GLOVE BIOGEL PI INDICATOR 7.0 (GLOVE) ×2
GLOVE BIOGEL PI INDICATOR 8 (GLOVE) ×2
GOWN STRL REUS W/ TWL LRG LVL3 (GOWN DISPOSABLE) ×1 IMPLANT
GOWN STRL REUS W/ TWL XL LVL3 (GOWN DISPOSABLE) ×1 IMPLANT
GOWN STRL REUS W/TWL LRG LVL3 (GOWN DISPOSABLE) ×3
GOWN STRL REUS W/TWL XL LVL3 (GOWN DISPOSABLE) ×3
GUIDEWIRE GLENOID 2.5X220 (WIRE) ×2 IMPLANT
HANDPIECE INTERPULSE COAX TIP (DISPOSABLE) ×3
HEAD HUMERAL AEQUALIS 46X17 (Head) ×3 IMPLANT
HEAD HUMERAL LOW OS 46X17 (Head) IMPLANT
HEMOSTAT SURGICEL 2X14 (HEMOSTASIS) ×3 IMPLANT
HOOD PEEL AWAY FLYTE STAYCOOL (MISCELLANEOUS) ×6 IMPLANT
KIT BASIN OR (CUSTOM PROCEDURE TRAY) ×3 IMPLANT
KIT TURNOVER KIT B (KITS) ×3 IMPLANT
MANIFOLD NEPTUNE II (INSTRUMENTS) ×3 IMPLANT
NDL MAYO TROCAR (NEEDLE) ×1 IMPLANT
NEEDLE MAYO TROCAR (NEEDLE) ×3 IMPLANT
NS IRRIG 1000ML POUR BTL (IV SOLUTION) ×3 IMPLANT
PACK SHOULDER (CUSTOM PROCEDURE TRAY) ×3 IMPLANT
PAD ARMBOARD 7.5X6 YLW CONV (MISCELLANEOUS) ×6 IMPLANT
PEGGED GLENOID CORTILOC (Orthopedic Implant) ×2 IMPLANT
PLATE LCP 4H 36MM 2.4MM (Plate) ×2 IMPLANT
RESTRAINT HEAD UNIVERSAL NS (MISCELLANEOUS) ×3 IMPLANT
RETRIEVER SUT HEWSON (MISCELLANEOUS) ×3 IMPLANT
SET HNDPC FAN SPRY TIP SCT (DISPOSABLE) ×1 IMPLANT
SLING ARM FOAM STRAP LRG (SOFTGOODS) ×3 IMPLANT
SLING ARM FOAM STRAP MED (SOFTGOODS) IMPLANT
SMARTMIX MINI TOWER (MISCELLANEOUS) ×3
SPONGE LAP 18X18 X RAY DECT (DISPOSABLE) ×3 IMPLANT
SPONGE LAP 4X18 RFD (DISPOSABLE) IMPLANT
STEM HUMERAL AEQ 74X3A 127.5D (Stem) ×2 IMPLANT
STRIP CLOSURE SKIN 1/2X4 (GAUZE/BANDAGES/DRESSINGS) ×2 IMPLANT
SUCTION FRAZIER HANDLE 10FR (MISCELLANEOUS) ×2
SUCTION TUBE FRAZIER 10FR DISP (MISCELLANEOUS) ×1 IMPLANT
SUPPORT WRAP ARM LG (MISCELLANEOUS) ×3 IMPLANT
SUT ETHIBOND NAB CT1 #1 30IN (SUTURE) ×9 IMPLANT
SUT FIBERWIRE #2 38 T-5 BLUE (SUTURE)
SUT MNCRL AB 4-0 PS2 18 (SUTURE) ×3 IMPLANT
SUT VIC AB 2-0 CT1 27 (SUTURE) ×3
SUT VIC AB 2-0 CT1 TAPERPNT 27 (SUTURE) ×1 IMPLANT
SUTURE FIBERWR #2 38 T-5 BLUE (SUTURE) IMPLANT
TAPE LABRALWHITE 1.5X36 (TAPE) ×3 IMPLANT
TAPE SUT LABRALTAP WHT/BLK (SUTURE) ×3 IMPLANT
TOWEL OR 17X26 10 PK STRL BLUE (TOWEL DISPOSABLE) ×3 IMPLANT
TOWER SMARTMIX MINI (MISCELLANEOUS) ×1 IMPLANT

## 2017-07-22 NOTE — Anesthesia Postprocedure Evaluation (Signed)
Anesthesia Post Note  [facility]  Procedure(s) Performed: RIGHT TOTAL SHOULDER ARTHROPLASTY (Right Shoulder)     Patient location during evaluation: PACU Anesthesia Type: General Level of consciousness: awake and alert Pain management: pain level controlled Vital Signs Assessment: post-procedure vital signs reviewed and stable Respiratory status: spontaneous breathing, nonlabored ventilation, respiratory function stable and patient connected to nasal cannula oxygen Cardiovascular status: blood pressure returned to baseline and stable Postop Assessment: no apparent nausea or vomiting Anesthetic complications: no    Last Vitals:  Vitals:   07/22/17 1647 07/22/17 2100  BP: 103/73 95/61  Pulse: 68 72  Resp: 15 14  Temp: 36.7 C 36.9 C  SpO2: 96% 96%    Last Pain:  Vitals:   07/22/17 2100  TempSrc: Oral  PainSc:                  Syon Tews

## 2017-07-22 NOTE — Anesthesia Preprocedure Evaluation (Signed)
Anesthesia Evaluation  Patient identified by MRN, date of birth, ID band Patient awake    Reviewed: Allergy & Precautions, H&P , NPO status , Patient's Chart, lab work & pertinent test results, reviewed documented beta blocker date and time   History of Anesthesia Complications (+) PONV, Family history of anesthesia reaction and history of anesthetic complications  Airway Mallampati: II  TM Distance: >3 FB Neck ROM: full    Dental no notable dental hx.    Pulmonary neg pulmonary ROS,    Pulmonary exam normal breath sounds clear to auscultation       Cardiovascular Exercise Tolerance: Good negative cardio ROS   Rhythm:regular Rate:Normal     Neuro/Psych PSYCHIATRIC DISORDERS Anxiety Depression TIA   GI/Hepatic Neg liver ROS, GERD  Medicated,  Endo/Other  Hypothyroidism   Renal/GU negative Renal ROS  negative genitourinary   Musculoskeletal  (+) Arthritis , Osteoarthritis,    Abdominal   Peds  Hematology negative hematology ROS (+) anemia ,   Anesthesia Other Findings   Reproductive/Obstetrics negative OB ROS                             Anesthesia Physical Anesthesia Plan  ASA: II  Anesthesia Plan: General   Post-op Pain Management: GA combined w/ Regional for post-op pain   Induction: Intravenous  PONV Risk Score and Plan: 4 or greater and Ondansetron, Dexamethasone, Treatment may vary due to age or medical condition, Midazolam and Propofol infusion  Airway Management Planned: Oral ETT and LMA  Additional Equipment:   Intra-op Plan:   Post-operative Plan: Extubation in OR  Informed Consent: I have reviewed the patients History and Physical, chart, labs and discussed the procedure including the risks, benefits and alternatives for the proposed anesthesia with the patient or authorized representative who has indicated his/her understanding and acceptance.   Dental Advisory  Given  Plan Discussed with: CRNA, Anesthesiologist and Surgeon  Anesthesia Plan Comments: (  )        Anesthesia Quick Evaluation

## 2017-07-22 NOTE — H&P (Signed)
Sarah Villarreal is an 67 y.o. female.   Chief Complaint: R shoulder pain and dysfunction  HPI: Endstage R shoulder arthritis with significant pain and dysfunction, failed conservative measures.  Pain interferes with sleep and quality of life.   Past Medical History:  Diagnosis Date  . Anemia    history of  . Anxiety   . Arthritis   . Depression   . Elevated cholesterol   . Endometrial polyp   . Family history of adverse reaction to anesthesia    sister has nausea/vomiting  . GERD (gastroesophageal reflux disease)    occasionally  . Headache   . 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 Osteopenia 02/2017   T score -2.1 distal third left forearm  . Pneumonia   . PONV (postoperative nausea and vomiting)   . TIA (transient ischemic attack) fall 2018   states it was possible    Past Surgical History:  Procedure Laterality Date  . abdominal lipoma    . APPENDECTOMY    . BREAST SURGERY  2011   radial scar removed  . DILATION AND CURETTAGE OF UTERUS    . HYSTEROSCOPY    . NASAL SINUS SURGERY    . NOSE SURGERY     plastic surgery  . SHOULDER SURGERY    . TONSILLECTOMY    . TOTAL HIP ARTHROPLASTY  2008-2012   Bilateral  . TOTAL HIP REVISION    . TUBAL LIGATION      Family History  Problem Relation Age of Onset  . Lung cancer Mother    Social History:  reports that she has never smoked. She has never used smokeless tobacco. She reports that she drinks alcohol. She reports that she does not use drugs.  Allergies:  Allergies  Allergen Reactions  . Onion Nausea And Vomiting  . Adhesive [Tape] Dermatitis    Certain band aids cause skin irritation   . Codeine Itching  . Meperidine Hcl Nausea And Vomiting    Medications Prior to Admission  Medication Sig Dispense Refill  . Cholecalciferol (VITAMIN D) 2000 units tablet Take 2,000 Units by mouth daily.    . Coenzyme Q10 (COQ10) 100 MG CAPS Take 1 capsule by mouth daily.    .  Echinacea 125 MG CAPS Take 1 capsule by mouth daily.    Marland Kitchen. ezetimibe (ZETIA) 10 MG tablet Take 10 mg by mouth daily.    . Ferrous Gluconate-C-Folic Acid (IRON-C PO) Take 1 tablet by mouth daily.    Marland Kitchen. gabapentin (NEURONTIN) 300 MG capsule Take 2 capsules by mouth 2 (two) times daily.     Marland Kitchen. HYDROcodone-acetaminophen (NORCO/VICODIN) 5-325 MG tablet Take 1 tablet by mouth as needed for moderate pain.    Marland Kitchen. levothyroxine (SYNTHROID, LEVOTHROID) 75 MCG tablet Take 75 mcg by mouth daily before breakfast.     . omeprazole (PRILOSEC) 40 MG capsule Take 40 mg by mouth at bedtime.    Marland Kitchen. PARoxetine (PAXIL) 20 MG tablet Take 20 mg by mouth every morning.      . potassium chloride (K-DUR,KLOR-CON) 10 MEQ tablet Take 20 mEq by mouth 2 (two) times daily.    . rizatriptan (MAXALT) 10 MG tablet Take 1 tablet by mouth every 2 (two) hours as needed for migraine.   0  . temazepam (RESTORIL) 30 MG capsule Take 1 capsule by mouth at bedtime.    . triamterene-hydrochlorothiazide (MAXZIDE-25) 37.5-25 MG per tablet TAKE ONE TABLET BY MOUTH DAILY (Patient taking differently: TAKE  ONE TABLET BY MOUTH TWICE  DAILY) 30 tablet 0  . vitamin C (ASCORBIC ACID) 500 MG tablet Take 500 mg by mouth daily.    Marland Kitchen zinc gluconate 50 MG tablet Take 50 mg by mouth daily.      No results found for this or any previous visit (from the past 48 hour(s)). No results found.  Review of Systems  All other systems reviewed and are negative.   Blood pressure (!) 152/82, pulse 88, temperature 98.6 F (37 C), temperature source Oral, resp. rate 20, SpO2 96 %. Physical Exam  Constitutional: She is oriented to person, place, and time. She appears well-developed and well-nourished.  HENT:  Head: Atraumatic.  Eyes: EOM are normal.  Cardiovascular: Intact distal pulses.  Respiratory: Effort normal.  Musculoskeletal:  R shoulder pain with limited ROM.  Neurological: She is alert and oriented to person, place, and time.  Skin: Skin is warm.   Psychiatric: She has a normal mood and affect. Her behavior is normal.     Assessment/Plan Endstage R shoulder arthritis with significant pain and dysfunction, failed conservative measures.  Pain interferes with sleep and quality of life. Plan R TSA Risks / benefits of surgery discussed Consent on chart  NPO for OR Preop antibiotics   Berline Lopes, MD 07/22/2017, 9:27 AM

## 2017-07-22 NOTE — Transfer of Care (Addendum)
Immediate Anesthesia Transfer of Care Note  Patient: Sarah Villarreal  Procedure(s) Performed: RIGHT TOTAL SHOULDER ARTHROPLASTY (Right Shoulder)  Patient Location: PACU  Anesthesia Type:General  Level of Consciousness: drowsy, patient cooperative and responds to stimulation  Airway & Oxygen Therapy: Patient Spontanous Breathing and Patient connected to face mask oxygen  Post-op Assessment: Report given to RN and Post -op Vital signs reviewed and stable  Post vital signs: Reviewed and stable  Last Vitals:  Vitals Value Taken Time  BP 150/117 07/22/2017  1:11 PM  Temp 36.2 C 07/22/2017  1:00 PM  Pulse 72 07/22/2017  1:13 PM  Resp 12 07/22/2017  1:13 PM  SpO2 100 % 07/22/2017  1:13 PM  Vitals shown include unvalidated device data.  Last Pain:  Vitals:   07/22/17 0821  TempSrc:   PainSc: 0-No pain      Patients Stated Pain Goal: 4 (07/22/17 16100821)  Complications: No apparent anesthesia complications

## 2017-07-22 NOTE — Anesthesia Procedure Notes (Signed)
Procedure Name: Intubation Date/Time: 07/22/2017 10:44 AM Performed by: Glynda Jaeger, CRNA Pre-anesthesia Checklist: Patient identified, Patient being monitored, Timeout performed, Emergency Drugs available and Suction available Patient Re-evaluated:Patient Re-evaluated prior to induction Oxygen Delivery Method: Circle System Utilized Preoxygenation: Pre-oxygenation with 100% oxygen Induction Type: IV induction Ventilation: Mask ventilation without difficulty Laryngoscope Size: Mac and 4 Grade View: Grade I Tube type: Oral Tube size: 7.0 mm Number of attempts: 1 Airway Equipment and Method: Stylet Placement Confirmation: ETT inserted through vocal cords under direct vision,  positive ETCO2 and breath sounds checked- equal and bilateral Secured at: 21 cm Tube secured with: Tape Dental Injury: Teeth and Oropharynx as per pre-operative assessment

## 2017-07-22 NOTE — Op Note (Addendum)
Procedure(s): RIGHT TOTAL SHOULDER ARTHROPLASTY Procedure Note  Sarah Villarreal female 67 y.o. 07/22/2017  Procedure(s) and Anesthesia Type:    * RIGHT TOTAL SHOULDER ARTHROPLASTY - Choice  Surgeon(s) and Role:    Jones Broom, MD - Primary   Indications:  67 y.o. female  With endstage right shoulder arthritis. Pain and dysfunction interfered with quality of life and nonoperative treatment with activity modification, NSAIDS and injections failed.     Surgeon: Berline Lopes   Assistants: Damita Lack PA-C [facility] was present and scrubbed throughout the procedure and was essential in positioning, retraction, exposure, and closure)  Anesthesia: General endotracheal anesthesia with preoperative interscalene block given by the attending anesthesiologist     Procedure Detail  RIGHT TOTAL SHOULDER ARTHROPLASTY  Findings: Tornier flex anatomic press-fit size 3 stem with a 46 head, cemented size medium 40 Cortiloc glenoid.  Significant amount of osteophyte was resected from the glenoid.  A lesser tuberosity osteotomy was performed and repaired at the conclusion of the procedure.  Estimated Blood Loss:  200 mL         Drains: None   Blood Given: none          Specimens: none        Complications:  * No complications entered in OR log *         Disposition: PACU - hemodynamically stable.         Condition: stable    Procedure:   The patient was identified in the preoperative holding area where I personally marked the operative extremity after verifying with the patient and consent. She  was taken to the operating room where She was transferred to the   operative table.  The patient received an interscalene block in   the holding area by the attending anesthesiologist.  General anesthesia was induced   in the operating room without complication.  The patient did receive IV  Ancef prior to the commencement of the procedure.  The patient was   placed in the  beach-chair position with the back raised about 30   degrees.  The nonoperative extremity and head and neck were carefully   positioned and padded protecting against neurovascular compromise.  The   left upper extremity was then prepped and draped in the standard sterile   fashion.    The appropriate operative time-out was performed with   Anesthesia, the perioperative staff, as well as myself and we all agreed   that the right side was the correct operative site.  An approximately   10 cm incision was made from the tip of the coracoid to the center point of the   humerus at the level of the axilla.  Dissection was carried down sharply   through subcutaneous tissues and cephalic vein was identified and taken   laterally with the deltoid.  The pectoralis major was taken medially.  The   upper 1 cm of the pectoralis major was released from its attachment on   the humerus.  The clavipectoral fascia was incised just lateral to the   conjoined tendon.  This incision was carried up to but not into the   coracoacromial ligament.  Digital palpation was used to prove   integrity of the axillary nerve which was protected throughout the   procedure.  Musculocutaneous nerve was not palpated in the operative   field.  Conjoined tendon was then retracted gently medially and the   deltoid laterally.  Anterior circumflex humeral vessels were clamped  and   coagulated.  The soft tissues overlying the biceps was incised and this   incision was carried across the transverse humeral ligament to the base   of the coracoid.  The biceps was noted to be severely degenerated. It was released from the superior labrum. The biceps was then tenodesed to the soft tissue just above   pectoralis major and the remaining portion of the biceps superiorly was   excised.  An osteotomy was performed at the lesser tuberosity.  The capsule was then   released all the way down to the 6 o'clock position of the humeral head.   The  humeral head was then delivered with simultaneous adduction,   extension and external rotation.  All humeral osteophytes were removed   and the anatomic neck of the humerus was marked and cut free hand at   approximately 25 degrees retroversion within about 3 mm of the cuff   reflection posteriorly.  The head size was estimated to be a 46 medium   offset.  At that point, the humeral head was retracted posteriorly with   a Fukuda retractor.   Remaining portion of the capsule was released at the base of the   coracoid.  The remaining biceps anchor and the entire anterior-inferior   labrum was excised.  The posterior labrum was also excised but the   posterior capsule was not released.  She had a significant amount of anterior inferior and posterior osteophytes which were resected with a small osteotome and rongeur.  The reamer was used to ream to concentric bone with punctate bleeding.  This gave an excellent concentric surface.  The center hole was then drilled for an anchor peg glenoid followed by the three peripheral holes and none of the holes   exited the glenoid wall.  The anterior inferior hole encountered metallic anchor and I was unable to fully seat the drill but I felt there was still enough depth to get meaningful purchase.  I then pulse irrigated these holes and dried   them with Surgicel.  The three peripheral holes were then   pressurized cemented and the anchor peg glenoid was placed and impacted   with an excellent fit.  The anterior inferior glenoid peg was trimmed down by about 3 mm to accommodate the metallic anchor in the glenoid.  The glenoid was a 40 medium component.  The proximal humerus was then again exposed taking care not to displace the glenoid.    The entry awl was used followed by sounding reamers and then sequentially broached from size 1 to 3. This was then left in place and the calcar planer was used. Trial head was placed with a 46.  With the trial implantation of the  component,  there was approximately 50% posterior translation with immediate snap back to the   anatomic position.  With forward elevation, there was no tendency   towards posterior subluxation.   The trial was removed and the final implant was prepared on a back table.  The trial was removed and the final implant was prepared on a back table.   3 small holes were drilled on the medial side of the lesser tuberosity osteotomy, through which 2 labral tapes were passed. The implant was then placed through the loop of the 2 labral tapes and impacted with an excellent press-fit. This achieved excellent anatomic reconstruction of the proximal humerus.  The joint was then copiously irrigated with pulse lavage.  The subscapularis and  lesser tuberosity osteotomy were then repaired using the 2 labral tapes previously passed in a double row fashion with horizontal mattress sutures medially brought over through bone tunnels.  Normally the sutures will be tied over bone bridge but I felt that her bone was so weak that it needed reinforcement so a mini fragment 4-hole plate was used to tie the sutures over. One #1 Ethibond was placed at the rotator interval just above   the lesser tuberosity. Copious irrigation was used. Skin was closed with 2-0 Vicryl sutures in the deep dermal layer and 4-0 Monocryl in a subcuticular  running fashion.  Sterile dressings were then applied including Aquacel.  The patient was placed in a sling and allowed to awaken from general anesthesia and taken to the recovery room in stable condition.      POSTOPERATIVE PLAN:  Early passive range of motion will be allowed with the goal of 0 degrees external rotation and 90 degrees forward elevation.  No internal rotation at this time.  No active motion of the arm until the lesser tuberosity heals.  The patient will likely be kept in the hospital for 1-2 days and then discharged home.

## 2017-07-22 NOTE — Discharge Instructions (Signed)

## 2017-07-22 NOTE — Anesthesia Procedure Notes (Signed)
Anesthesia Regional Block: Interscalene brachial plexus block   Pre-Anesthetic Checklist: ,, timeout performed, Correct Patient, Correct Site, Correct Laterality, Correct Procedure, Correct Position, site marked, Risks and benefits discussed,  Surgical consent,  Pre-op evaluation,  At surgeon's request and post-op pain management  Laterality: Right  Prep: chloraprep       Needles:  Injection technique: Single-shot  Needle Type: Echogenic Stimulator Needle     Needle Length: 5cm  Needle Gauge: 22     Additional Needles:   Procedures:, nerve stimulator,,, ultrasound used (permanent image in chart),,,,  Narrative:  Start time: 07/22/2017 9:25 AM End time: 07/22/2017 9:30 AM Injection made incrementally with aspirations every 5 mL.  Performed by: Personally  Anesthesiologist: Shelton SilvasHollis, Kevin D, MD  Additional Notes: Functioning IV was confirmed and monitors were applied.  A 50mm 22ga Arrow echogenic stimulator needle was used. Sterile prep and drape,hand hygiene and sterile gloves were used. Ultrasound guidance: relevant anatomy identified, needle position confirmed, local anesthetic spread visualized around nerve(s)., vascular puncture avoided.  Image printed for medical record. Negative aspiration and negative test dose prior to incremental administration of local anesthetic. The patient tolerated the procedure well.

## 2017-07-23 LAB — BASIC METABOLIC PANEL
ANION GAP: 6 (ref 5–15)
BUN: 13 mg/dL (ref 6–20)
CO2: 29 mmol/L (ref 22–32)
Calcium: 8.9 mg/dL (ref 8.9–10.3)
Chloride: 102 mmol/L (ref 101–111)
Creatinine, Ser: 0.75 mg/dL (ref 0.44–1.00)
GLUCOSE: 132 mg/dL — AB (ref 65–99)
POTASSIUM: 3.1 mmol/L — AB (ref 3.5–5.1)
Sodium: 137 mmol/L (ref 135–145)

## 2017-07-23 LAB — CBC
HEMATOCRIT: 34.2 % — AB (ref 36.0–46.0)
Hemoglobin: 11 g/dL — ABNORMAL LOW (ref 12.0–15.0)
MCH: 30.2 pg (ref 26.0–34.0)
MCHC: 32.2 g/dL (ref 30.0–36.0)
MCV: 94 fL (ref 78.0–100.0)
PLATELETS: 295 10*3/uL (ref 150–400)
RBC: 3.64 MIL/uL — AB (ref 3.87–5.11)
RDW: 12.5 % (ref 11.5–15.5)
WBC: 13.2 10*3/uL — AB (ref 4.0–10.5)

## 2017-07-23 NOTE — Discharge Summary (Signed)
Patient ID: Sarah Villarreal MRN: [id number] DOB/AGE: [birth year] 67 y.o.  Admit date: 07/22/2017 Discharge date: 07/23/2017  Admission Diagnoses:  Active Problems:   Status post total shoulder arthroplasty, right   Discharge Diagnoses:  Same  Past Medical History:  Diagnosis Date  . Anemia    history of  . Anxiety   . Arthritis   . Depression   . Elevated cholesterol   . Endometrial polyp   . Family history of adverse reaction to anesthesia    sister has nausea/vomiting  . GERD (gastroesophageal reflux disease)    occasionally  . Headache   . 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 Osteopenia 02/2017   T score -2.1 distal third left forearm  . Pneumonia   . PONV (postoperative nausea and vomiting)   . TIA (transient ischemic attack) fall 2018   states it was possible    Surgeries: Procedure(s): RIGHT TOTAL SHOULDER ARTHROPLASTY on 07/22/2017   Consultants:   Discharged Condition: Improved  Hospital Course: Sarah Villarreal is an 67 y.o. female who was admitted 07/22/2017 for operative treatment of right end stage shoulder OA. Patient has severe unremitting pain that affects sleep, daily activities, and work/hobbies. After pre-op clearance the patient was taken to the operating room on 07/22/2017 and underwent  Procedure(s): RIGHT TOTAL SHOULDER ARTHROPLASTY.    Patient was given perioperative antibiotics:  Anti-infectives (From admission, onward)   Start     Dose/Rate Route Frequency Ordered Stop   07/22/17 1700  ceFAZolin (ANCEF) IVPB 1 g/50 mL premix     1 g 100 mL/hr over 30 Minutes Intravenous Every 6 hours 07/22/17 1649 07/23/17 0737   07/22/17 0900  ceFAZolin (ANCEF) IVPB 2g/100 mL premix     2 g 200 mL/hr over 30 Minutes Intravenous To ShortStay Surgical 07/21/17 0945 07/22/17 1045       Patient was given sequential compression devices, early ambulation, and chemoprophylaxis to prevent DVT.  Patient benefited maximally  from hospital stay and there were no complications.    Recent vital signs:  Patient Vitals for the past 24 hrs:  BP Temp Temp src Pulse Resp SpO2  07/23/17 0415 114/71 97.7 F (36.5 C) Oral 66 - 95 %  07/23/17 0023 104/77 97.7 F (36.5 C) Oral 93 16 92 %  07/22/17 2100 95/61 98.4 F (36.9 C) Oral 72 14 96 %  07/22/17 1647 103/73 98 F (36.7 C) Oral 68 15 96 %  07/22/17 1625 102/72 - - (!) 58 19 94 %  07/22/17 1525 (!) 132/119 - - 64 16 95 %  07/22/17 1425 110/65 - - 70 15 94 %  07/22/17 1400 - - - 71 (!) 25 94 %  07/22/17 1355 95/69 - - 71 18 93 %  07/22/17 1340 111/70 - - 73 14 91 %  07/22/17 1325 103/60 - - 70 14 93 %  07/22/17 1315 (!) 109/59 (!) 97.2 F (36.2 C) - 73 12 100 %  07/22/17 1300 - (!) 97.2 F (36.2 C) - - - -  07/22/17 0935 (!) 147/79 - - 75 19 92 %  07/22/17 0930 129/78 - - 69 13 90 %  07/22/17 0925 132/69 - - 69 13 99 %     Recent laboratory studies:  Recent Labs    07/23/17 0428  WBC 13.2*  HGB 11.0*  HCT 34.2*  PLT 295  NA 137  K 3.1*  CL 102  CO2 29  BUN 13  CREATININE 0.75  GLUCOSE 132*  CALCIUM 8.9     Discharge Medications:   Allergies as of 07/23/2017      Reactions   Onion Nausea And Vomiting   Adhesive [tape] Dermatitis   Certain band aids cause skin irritation    Codeine Itching   Meperidine Hcl Nausea And Vomiting      Medication List    STOP taking these medications   HYDROcodone-acetaminophen 5-325 MG tablet Commonly known as:  NORCO/VICODIN     TAKE these medications   CoQ10 100 MG Caps Take 1 capsule by mouth daily.   docusate sodium 100 MG capsule Commonly known as:  COLACE Take 1 capsule (100 mg total) by mouth 3 (three) times daily as needed.   Echinacea 125 MG Caps Take 1 capsule by mouth daily.   ezetimibe 10 MG tablet Commonly known as:  ZETIA Take 10 mg by mouth daily.   gabapentin 300 MG capsule Commonly known as:  NEURONTIN Take 2 capsules by mouth 2 (two) times daily.   IRON-C PO Take 1  tablet by mouth daily.   levothyroxine 75 MCG tablet Commonly known as:  SYNTHROID, LEVOTHROID Take 75 mcg by mouth daily before breakfast.   methocarbamol 500 MG tablet Commonly known as:  ROBAXIN Take 1 tablet (500 mg total) by mouth 3 (three) times daily.   omeprazole 40 MG capsule Commonly known as:  PRILOSEC Take 40 mg by mouth at bedtime.   oxyCODONE-acetaminophen 5-325 MG tablet Commonly known as:  PERCOCET Take 1 tablet by mouth every 4 (four) hours as needed for severe pain.   PARoxetine 20 MG tablet Commonly known as:  PAXIL Take 20 mg by mouth every morning.   potassium chloride 10 MEQ tablet Commonly known as:  K-DUR,KLOR-CON Take 20 mEq by mouth 2 (two) times daily.   rizatriptan 10 MG tablet Commonly known as:  MAXALT Take 1 tablet by mouth every 2 (two) hours as needed for migraine.   temazepam 30 MG capsule Commonly known as:  RESTORIL Take 1 capsule by mouth at bedtime.   triamterene-hydrochlorothiazide 37.5-25 MG tablet Commonly known as:  MAXZIDE-25 TAKE ONE TABLET BY MOUTH DAILY What changed:    how much to take  how to take this  when to take this   vitamin C 500 MG tablet Commonly known as:  ASCORBIC ACID Take 500 mg by mouth daily.   Vitamin D 2000 units tablet Take 2,000 Units by mouth daily.   zinc gluconate 50 MG tablet Take 50 mg by mouth daily.       Diagnostic Studies: Dg Chest 2 View  Result Date: 07/15/2017 CLINICAL DATA:  Preoperative evaluation for upcoming shoulder replacement EXAM: CHEST - 2 VIEW COMPARISON:  07/24/2014 FINDINGS: The heart size and mediastinal contours are within normal limits. Both lungs are clear. The visualized skeletal structures are unremarkable. Postsurgical changes are noted in the right shoulder. IMPRESSION: No active cardiopulmonary disease. Electronically Signed   By: Alcide Clever M.D.   On: 07/15/2017 15:14   Ct Shoulder Right Wo Contrast  Result Date: 07/02/2017 CLINICAL DATA:  Decreased  range of motion, preop shoulder replacement EXAM: CT OF THE UPPER RIGHT EXTREMITY WITHOUT CONTRAST TECHNIQUE: Multidetector CT imaging of the upper right extremity was performed according to the standard protocol. COMPARISON:  None. FINDINGS: Bones/Joint/Cartilage No fracture or dislocation. Normal alignment. No joint effusion. Prior anterior inferior glenoid repair. Severe right glenohumeral joint space narrowing with marginal osteophytes and fragmentation of the inferior glenoid. Bulky inferior humeral  marginal osteophyte. Type I acromion.  Mild arthropathy of the acromioclavicular joint. 6 Ligaments Ligaments are suboptimally evaluated by CT. Muscles and Tendons Muscles are normal.  No muscle atrophy. Soft tissue No fluid collection or hematoma.  No soft tissue mass. IMPRESSION: 1. Severe osteoarthritis of right shoulder. Electronically Signed   By: Elige KoHetal  Patel   On: 07/02/2017 11:54   Dg Shoulder Right Port  Result Date: 07/22/2017 CLINICAL DATA:  Status post right shoulder replacement EXAM: PORTABLE RIGHT SHOULDER COMPARISON:  Jul 15, 2017 FINDINGS: Patient is status post right shoulder replacement. The prosthesis is in good position. Three screws overlie the glenoid, unchanged. A linear density is seen between 2 of the screws and a plate projects over the greater tubercle. IMPRESSION: 1. The prosthesis in the humerus is in good position. Screws over the glenoid are stable. 2. There is some sort of plate over the greater tubercle and some sort of linear metallic density between 2 of the glenoid screws. Recommend clinical correlation. Electronically Signed   By: Gerome Samavid  Williams III M.D   On: 07/22/2017 13:42    Disposition: Discharge disposition: 01-Home or Self Care       Discharge Instructions    Call MD / Call 911   Complete by:  As directed    If you experience chest pain or shortness of breath, CALL 911 and be transported to the hospital emergency room.  If you develope a fever above 101 F,  pus (white drainage) or increased drainage or redness at the wound, or calf pain, call your surgeon's office.   Constipation Prevention   Complete by:  As directed    Drink plenty of fluids.  Prune juice may be helpful.  You may use a stool softener, such as Colace (over the counter) 100 mg twice a day.  Use MiraLax (over the counter) for constipation as needed.   Diet - low sodium heart healthy   Complete by:  As directed    Increase activity slowly as tolerated   Complete by:  As directed       Follow-up Information    Jones Broomhandler, Justin, MD. Schedule an appointment as soon as possible for a visit in 2 weeks.   Specialty:  Orthopedic Surgery Contact information: [address] [place] [address] [phone]            Signed: Jiles HaroldLALIBERTE, Bulah Lurie 07/23/2017, 8:15 AM

## 2017-07-23 NOTE — Evaluation (Addendum)
Occupational Therapy Evaluation Patient Details Name: Sarah Villarreal MRN: [id number] DOB: 11/09/1950 Today's Date: 07/23/2017    History of Present Illness Pt is a 67 y/o female s/p R TSA    Clinical Impression   This 67 y/o F presents with the above. At baseline pt is independent with ADLs and functional mobility. Pt completing functional mobility without AD and supervision throughout this session; currently requires modA for UB ADLs secondary to R UE functional limitations. Educated both pt and spouse regarding shoulder protocol, proper movements, safety and compensatory strategies for completing ADLs while adhering to shoulder precautions with both pt and spouse verbalizing understanding; questions answered throughout. Pt will return home with spouse who is able to assist with ADLs PRN after d/c home. Feel pt is safe to return home from acute OT standpoint and follow up as recommended per MD. Will sign off at this time.     Follow Up Recommendations  Follow surgeon's recommendation for DC plan and follow-up therapies;Supervision/Assistance - 24 hour    Equipment Recommendations  None recommended by OT           Precautions / Restrictions Precautions Precautions: Shoulder Type of Shoulder Precautions: Sling on except during ADL/exercise; NWB to UE; okay for AROM to e/w/h; no pendulums; no AROM of shoulder, no shoulder abduction; okay for shoulder PROM FF 0-90* and ER to neutral Shoulder Interventions: Shoulder sling/immobilizer;Off for dressing/bathing/exercises Precaution Booklet Issued: Yes (comment) Precaution Comments: issued and reviewed with pt/spouse  Restrictions Weight Bearing Restrictions: Yes RUE Weight Bearing: Non weight bearing      Mobility Bed Mobility Overal bed mobility: Needs Assistance Bed Mobility: Supine to Sit     Supine to sit: Supervision     General bed mobility comments: for safety   Transfers Overall transfer level: Needs assistance Equipment  used: None Transfers: Sit to/from Stand Sit to Stand: Supervision         General transfer comment: for safety     Balance Overall balance assessment: No apparent balance deficits (not formally assessed)                                         ADL either performed or assessed with clinical judgement   ADL Overall ADL's : Needs assistance/impaired Eating/Feeding: Set up;Sitting   Grooming: Set up;Sitting   Upper Body Bathing: Minimal assistance;Sitting   Lower Body Bathing: Sit to/from stand;Min guard   Upper Body Dressing : Sitting;With caregiver independent assisting;Moderate assistance   Lower Body Dressing: Min guard;Sit to/from stand   Toilet Transfer: Min guard;Ambulation;Regular Teacher, adult educationToilet Toilet Transfer Details (indicate cue type and reason): simulated in transfer to/from EOB  Toileting- Clothing Manipulation and Hygiene: Min guard;Sit to/from stand       Functional mobility during ADLs: Min guard General ADL Comments: educated pt and spouse on shoulder protocol, HEP, safety and compensatory strategies for completing ADLs while adhering to precautions      Vision         Perception     Praxis      Pertinent Vitals/Pain Pain Assessment: 0-10 Pain Score: 3  Pain Location: R shoulder  Pain Descriptors / Indicators: Guarding;Sore Pain Intervention(s): Monitored during session;Repositioned     Hand Dominance Right   Extremity/Trunk Assessment Upper Extremity Assessment Upper Extremity Assessment: RUE deficits/detail RUE Deficits / Details: s/p R TSA  RUE: Unable to fully assess due to immobilization   Lower  Extremity Assessment Lower Extremity Assessment: Overall WFL for tasks assessed       Communication     Cognition Arousal/Alertness: Awake/alert Behavior During Therapy: WFL for tasks assessed/performed Overall Cognitive Status: Within Functional Limits for tasks assessed                                      General Comments       Exercises General Exercises - Upper Extremity Shoulder Flexion: PROM;5 reps;Supine;Right(0-90*) Elbow Flexion: AROM;10 reps;Right;Seated Elbow Extension: AROM;10 reps;Right;Seated Wrist Flexion: AROM;10 reps;Right;Seated Wrist Extension: AROM;10 reps;Right;Seated Digit Composite Flexion: AROM;10 reps;Right;Seated Composite Extension: AROM;10 reps;Right;Seated Shoulder Exercises Neck Flexion: AROM;Seated Neck Extension: AROM;Seated Neck Lateral Flexion - Right: Seated Neck Lateral Flexion - Left: AROM;Seated   Shoulder Instructions Shoulder Instructions Donning/doffing shirt without moving shoulder: Moderate assistance;Patient able to independently direct caregiver;Caregiver independent with task Method for sponge bathing under operated UE: Min-guard;Caregiver independent with task;Patient able to independently direct caregiver Donning/doffing sling/immobilizer: Moderate assistance;Caregiver independent with task;Patient able to independently direct caregiver Correct positioning of sling/immobilizer: Minimal assistance;Caregiver independent with task;Patient able to independently direct caregiver ROM for elbow, wrist and digits of operated UE: Min-guard Sling wearing schedule (on at all times/off for ADL's): Independent Proper positioning of operated UE when showering: Min-guard Positioning of UE while sleeping: Minimal assistance;Patient able to independently direct caregiver;Caregiver independent with task    Home Living Family/patient expects to be discharged to:: Private residence Living Arrangements: Spouse/significant other Available Help at Discharge: Family Type of Home: House Home Access: Stairs to enter Secretary/administrator of Steps: 11 + 4  Entrance Stairs-Rails: Left Home Layout: Multi-level Alternate Level Stairs-Number of Steps: 2   Bathroom Shower/Tub: Tub/shower unit;Walk-in shower   Bathroom Toilet: Standard     Home Equipment:  Environmental consultant - 2 wheels;Bedside commode;Cane - single point;Shower seat - built in          Prior Functioning/Environment Level of Independence: Independent                 OT Problem List: Decreased knowledge of precautions;Impaired UE functional use      OT Treatment/Interventions:      OT Goals(Current goals can be found in the care plan section) Acute Rehab OT Goals Patient Stated Goal: return home today  OT Goal Formulation: All assessment and education complete, DC therapy  OT Frequency:     Barriers to D/C:            Co-evaluation              AM-PAC PT "6 Clicks" Daily Activity     Outcome Measure Help from another person eating meals?: None Help from another person taking care of personal grooming?: A Little Help from another person toileting, which includes using toliet, bedpan, or urinal?: A Little Help from another person bathing (including washing, rinsing, drying)?: A Little Help from another person to put on and taking off regular upper body clothing?: A Lot Help from another person to put on and taking off regular lower body clothing?: A Little 6 Click Score: 18   End of Session Equipment Utilized During Treatment: Other (comment)(sling ) Nurse Communication: Mobility status  Activity Tolerance: Patient tolerated treatment well Patient left: with family/visitor present;Other (comment)(sitting EOB )  OT Visit Diagnosis: Other abnormalities of gait and mobility (R26.89);Other (comment)(s/p r TSA )  Time: 7829-5621 OT Time Calculation (min): 28 min Charges:  OT General Charges $OT Visit: 1 Visit OT Evaluation $OT Eval Moderate Complexity: 1 Mod OT Treatments $Self Care/Home Management : 8-22 mins G-Codes:     Marcy Siren, OT Pager [phone] 07/23/2017   Sarah Villarreal 07/23/2017, 2:03 PM

## 2017-07-23 NOTE — Progress Notes (Signed)
   PATIENT ID: Sarah FittingSandra W Villarreal   1 Day Post-Op Procedure(s) (LRB): RIGHT TOTAL SHOULDER ARTHROPLASTY (Right)  Subjective: Doing really well, no pain. Block still working. Hopeful to go home. No other complaints or concerns.   Objective:  Vitals:   07/23/17 0023 07/23/17 0415  BP: 104/77 114/71  Pulse: 93 66  Resp: 16   Temp: 97.7 F (36.5 C) 97.7 F (36.5 C)  SpO2: 92% 95%     R UE dressing c/d/i Wiggles fingers  Labs:  Recent Labs    07/23/17 0428  HGB 11.0*   Recent Labs    07/23/17 0428  WBC 13.2*  RBC 3.64*  HCT 34.2*  PLT 295   Recent Labs    07/23/17 0428  NA 137  K 3.1*  CL 102  CO2 29  BUN 13  CREATININE 0.75  GLUCOSE 132*  CALCIUM 8.9    Assessment and Plan: 1 day s/p R TSA OT-PROM goal to 90FF and 0ER Hypokalemia- taking K+ supplement D/c home when cleared by OT Scripts in chart, fu with Dr. Ave Filterhandler in 2 weeks  VTE proph: ASA, SCDs

## 2017-07-23 NOTE — Plan of Care (Signed)
  Problem: Health Behavior/Discharge Planning: Goal: Ability to manage health-related needs will improve Outcome: Adequate for Discharge   

## 2017-07-26 ENCOUNTER — Encounter (HOSPITAL_COMMUNITY): Payer: Self-pay | Admitting: Orthopedic Surgery

## 2017-10-21 ENCOUNTER — Other Ambulatory Visit: Payer: Self-pay | Admitting: Orthopedic Surgery

## 2017-10-21 DIAGNOSIS — Z471 Aftercare following joint replacement surgery: Secondary | ICD-10-CM

## 2017-10-21 DIAGNOSIS — Z96611 Presence of right artificial shoulder joint: Secondary | ICD-10-CM

## 2017-11-08 ENCOUNTER — Ambulatory Visit
Admission: RE | Admit: 2017-11-08 | Discharge: 2017-11-08 | Disposition: A | Discharge status: Home / Self Care | Payer: Medicare Other | Source: Ambulatory Visit | Attending: Orthopedic Surgery | Admitting: Orthopedic Surgery

## 2017-11-08 DIAGNOSIS — Z96611 Presence of right artificial shoulder joint: Secondary | ICD-10-CM

## 2017-11-08 DIAGNOSIS — Z471 Aftercare following joint replacement surgery: Secondary | ICD-10-CM

## 2017-11-08 MED ORDER — IOPAMIDOL (ISOVUE-M 200) INJECTION 41%
15.0000 mL | Freq: Once | INTRAMUSCULAR | Status: AC
  Administered 2017-11-08: 15 mL via INTRA_ARTICULAR

## 2017-11-23 ENCOUNTER — Other Ambulatory Visit: Payer: Self-pay | Admitting: Orthopedic Surgery

## 2017-11-25 ENCOUNTER — Other Ambulatory Visit: Payer: Self-pay | Admitting: Orthopedic Surgery

## 2017-12-21 NOTE — Pre-Procedure Instructions (Signed)
Sarah Villarreal [name]  12/21/2017      [id number] - [place], [place] - [address] [place] [address] Phone: [phone] Fax: [phone]    Your procedure is scheduled on Thurs. Nov, 14, 2019 from 7:30AM-9:30AM  Report to [facility] Admitting Entrance "A" at 5:30AM  Call this number if you have problems the morning of surgery:  [phone]   Remember:  Do not eat or drink after midnight on Nov. 13th    Take these medicines the morning of surgery with A SIP OF WATER: BuPROPion (WELLBUTRIN XL), Ezetimibe (ZETIA), FLOVENT- bring with you the day of surgery, Fluticasone (FLONASE), Gabapentin (NEURONTIN), Levothyroxine (SYNTHROID, LEVOTHROID), and PARoxetine (PAXIL)    If needed: Omeprazole (PRILOSEC) and HYDROcodone-acetaminophen (NORCO/VICODIN)  7 days before surgery (12/23/17), stop taking all Other Aspirin Products, Vitamins, Fish oils, and Herbal medications. Also stop all NSAIDS i.e. Advil, Ibuprofen, Motrin, Aleve, Anaprox, Naproxen, BC, Goody Powders, and all Supplements.    Do not wear jewelry, make-up or nail polish.  Do not wear lotions, powders, or perfumes, or deodorant.  Do not shave 48 hours prior to surgery.    Do not bring valuables to the hospital.  [name] is not responsible for any belongings or valuables.  Contacts, dentures or bridgework may not be worn into surgery.  Leave your suitcase in the car.  After surgery it may be brought to your room.  For patients admitted to the hospital, discharge time will be determined by your treatment team.  Patients discharged the day of surgery will not be allowed to drive home.   Special instructions:   [name]  Before surgery, you can play an important role. Because skin is not sterile, your skin needs to be as free of germs as possible. You can reduce the number of germs on your skin by washing with CHG  (chlorahexidine gluconate) Soap before surgery.  CHG is an antiseptic cleaner which kills germs and bonds with the skin to continue killing germs even after washing.    Oral Hygiene is also important to reduce your risk of infection.  Remember - BRUSH YOUR TEETH THE MORNING OF SURGERY WITH YOUR REGULAR TOOTHPASTE  Please do not use if you have an allergy to CHG or antibacterial soaps. If your skin becomes reddened/irritated stop using the CHG.  Do not shave (including legs and underarms) for at least 48 hours prior to first CHG shower. It is OK to shave your face.  Please follow these instructions carefully.   1. Shower the NIGHT BEFORE SURGERY and the MORNING OF SURGERY with CHG.   2. If you chose to wash your hair, wash your hair first as usual with your normal shampoo.  3. After you shampoo, rinse your hair and body thoroughly to remove the shampoo.  4. Use CHG as you would any other liquid soap. You can apply CHG directly to the skin and wash gently with a scrungie or a clean washcloth.   5. Apply the CHG Soap to your body ONLY FROM THE NECK DOWN.  Do not use on open wounds or open sores. Avoid contact with your eyes, ears, mouth and genitals (private parts). Wash Face and genitals (private parts)  with your normal soap.  6. Wash thoroughly, paying special attention to the area where your surgery will be performed.  7. Thoroughly rinse your body with warm water  from the neck down.  8. DO NOT shower/wash with your normal soap after using and rinsing off the CHG Soap.  9. Pat yourself dry with a CLEAN TOWEL.  10. Wear CLEAN PAJAMAS to bed the night before surgery, wear comfortable clothes the morning of surgery  11. Place CLEAN SHEETS on your bed the night of your first shower and DO NOT SLEEP WITH PETS.  Day of Surgery:  Do not apply any deodorants/lotions.  Please wear clean clothes to the hospital/surgery center.   Remember to brush your teeth WITH YOUR REGULAR  TOOTHPASTE.  Please read over the following fact sheets that you were given. Pain Booklet, Coughing and Deep Breathing, MRSA Information and Surgical Site Infection Prevention

## 2017-12-22 ENCOUNTER — Encounter (HOSPITAL_COMMUNITY)
Admission: RE | Admit: 2017-12-22 | Discharge: 2017-12-22 | Disposition: A | Discharge status: Home / Self Care | Payer: Medicare Other | Source: Ambulatory Visit | Attending: Orthopedic Surgery | Admitting: Orthopedic Surgery

## 2017-12-22 ENCOUNTER — Other Ambulatory Visit: Payer: Self-pay

## 2017-12-22 ENCOUNTER — Encounter (HOSPITAL_COMMUNITY): Payer: Self-pay

## 2017-12-22 DIAGNOSIS — T849XXA Unspecified complication of internal orthopedic prosthetic device, implant and graft, initial encounter: Secondary | ICD-10-CM | POA: Diagnosis not present

## 2017-12-22 DIAGNOSIS — Y838 Other surgical procedures as the cause of abnormal reaction of the patient, or of later complication, without mention of misadventure at the time of the procedure: Secondary | ICD-10-CM | POA: Insufficient documentation

## 2017-12-22 DIAGNOSIS — Z79899 Other long term (current) drug therapy: Secondary | ICD-10-CM | POA: Diagnosis not present

## 2017-12-22 DIAGNOSIS — Z7951 Long term (current) use of inhaled steroids: Secondary | ICD-10-CM | POA: Diagnosis not present

## 2017-12-22 DIAGNOSIS — D649 Anemia, unspecified: Secondary | ICD-10-CM | POA: Insufficient documentation

## 2017-12-22 DIAGNOSIS — Z96643 Presence of artificial hip joint, bilateral: Secondary | ICD-10-CM | POA: Diagnosis not present

## 2017-12-22 DIAGNOSIS — M810 Age-related osteoporosis without current pathological fracture: Secondary | ICD-10-CM | POA: Diagnosis not present

## 2017-12-22 DIAGNOSIS — E7800 Pure hypercholesterolemia, unspecified: Secondary | ICD-10-CM | POA: Diagnosis not present

## 2017-12-22 DIAGNOSIS — Z96611 Presence of right artificial shoulder joint: Secondary | ICD-10-CM | POA: Insufficient documentation

## 2017-12-22 DIAGNOSIS — F329 Major depressive disorder, single episode, unspecified: Secondary | ICD-10-CM | POA: Diagnosis not present

## 2017-12-22 DIAGNOSIS — F419 Anxiety disorder, unspecified: Secondary | ICD-10-CM | POA: Diagnosis not present

## 2017-12-22 DIAGNOSIS — K219 Gastro-esophageal reflux disease without esophagitis: Secondary | ICD-10-CM | POA: Diagnosis not present

## 2017-12-22 DIAGNOSIS — Z01818 Encounter for other preprocedural examination: Secondary | ICD-10-CM | POA: Diagnosis present

## 2017-12-22 DIAGNOSIS — E890 Postprocedural hypothyroidism: Secondary | ICD-10-CM | POA: Diagnosis not present

## 2017-12-22 HISTORY — DX: Age-related osteoporosis without current pathological fracture: M81.0

## 2017-12-22 LAB — CBC WITH DIFFERENTIAL/PLATELET
Abs Immature Granulocytes: 0.02 10*3/uL (ref 0.00–0.07)
Basophils Absolute: 0.1 10*3/uL (ref 0.0–0.1)
Basophils Relative: 1 %
EOS PCT: 6 %
Eosinophils Absolute: 0.5 10*3/uL (ref 0.0–0.5)
HEMATOCRIT: 42.3 % (ref 36.0–46.0)
HEMOGLOBIN: 13.2 g/dL (ref 12.0–15.0)
Immature Granulocytes: 0 %
LYMPHS ABS: 2.6 10*3/uL (ref 0.7–4.0)
LYMPHS PCT: 29 %
MCH: 29.4 pg (ref 26.0–34.0)
MCHC: 31.2 g/dL (ref 30.0–36.0)
MCV: 94.2 fL (ref 80.0–100.0)
MONO ABS: 0.6 10*3/uL (ref 0.1–1.0)
MONOS PCT: 7 %
Neutro Abs: 5 10*3/uL (ref 1.7–7.7)
Neutrophils Relative %: 57 %
Platelets: 376 10*3/uL (ref 150–400)
RBC: 4.49 MIL/uL (ref 3.87–5.11)
RDW: 13.7 % (ref 11.5–15.5)
WBC: 8.9 10*3/uL (ref 4.0–10.5)
nRBC: 0 % (ref 0.0–0.2)

## 2017-12-22 LAB — COMPREHENSIVE METABOLIC PANEL
ALK PHOS: 104 U/L (ref 38–126)
ALT: 28 U/L (ref 0–44)
ANION GAP: 11 (ref 5–15)
AST: 26 U/L (ref 15–41)
Albumin: 4.1 g/dL (ref 3.5–5.0)
BILIRUBIN TOTAL: 0.5 mg/dL (ref 0.3–1.2)
BUN: 14 mg/dL (ref 8–23)
CALCIUM: 9.7 mg/dL (ref 8.9–10.3)
CO2: 27 mmol/L (ref 22–32)
Chloride: 101 mmol/L (ref 98–111)
Creatinine, Ser: 0.76 mg/dL (ref 0.44–1.00)
GFR calc non Af Amer: >60 mL/min (ref >60)
GLUCOSE: 110 mg/dL — AB (ref 70–99)
Potassium: 3.7 mmol/L (ref 3.5–5.1)
Sodium: 139 mmol/L (ref 135–145)
TOTAL PROTEIN: 7.2 g/dL (ref 6.5–8.1)

## 2017-12-22 LAB — TYPE AND SCREEN
ABO/RH(D): B POS
Antibody Screen: NEGATIVE

## 2017-12-22 LAB — URINALYSIS, ROUTINE W REFLEX MICROSCOPIC
Bilirubin Urine: NEGATIVE
Glucose, UA: NEGATIVE mg/dL
HGB URINE DIPSTICK: NEGATIVE
Ketones, ur: NEGATIVE mg/dL
LEUKOCYTES UA: NEGATIVE
Nitrite: NEGATIVE
Protein, ur: NEGATIVE mg/dL
SPECIFIC GRAVITY, URINE: 1.018 (ref 1.005–1.030)
pH: 7 (ref 5.0–8.0)

## 2017-12-22 LAB — APTT: aPTT: 28 seconds (ref 24–36)

## 2017-12-22 LAB — SURGICAL PCR SCREEN
MRSA, PCR: NEGATIVE
Staphylococcus aureus: NEGATIVE

## 2017-12-22 LAB — PROTIME-INR
INR: 0.93
Prothrombin Time: 12.4 seconds (ref 11.4–15.2)

## 2017-12-22 NOTE — Progress Notes (Signed)
PCP - Luella Cook Hedgecock PA  Chest x-ray - 08/2017 requested EKG - 08/2017 requested ECHO - 2018 requested  Blood Thinner Instructions: N/A Aspirin Instructions:N/A  Anesthesia review: yes, requested docs  Patient denies shortness of breath, fever, cough and chest pain at PAT appointment   Patient verbalized understanding of instructions that were given to them at the PAT appointment. Patient was also instructed that they will need to review over the PAT instructions again at home before surgery.

## 2017-12-23 ENCOUNTER — Encounter (HOSPITAL_COMMUNITY)
Admission: RE | Disposition: A | Discharge status: Home / Self Care | Payer: Self-pay | Source: Ambulatory Visit | Attending: Orthopedic Surgery

## 2017-12-23 SURGERY — REVISION, TOTAL ARTHROPLASTY, SHOULDER
Anesthesia: Choice | Laterality: Right

## 2017-12-23 NOTE — Progress Notes (Signed)
Anesthesia Chart Review:  Case:  [id number] Date/Time:  12/30/17 0715   Procedure:  RIGHT TOTAL SHOULDER REVISION TO REVERSE SHOULDER TO REVERSE ARHTROPLASTY (Right Shoulder)   Anesthesia type:  Choice   Pre-op diagnosis:  FAILED RIGHT TOTAL SHOULDER   Location:  [facility] ROOM 07 / [facility]   Surgeon:  Jones Broom, MD      DISCUSSION: Patient is a 67 year old female scheduled for the above procedure. She is s/p right total shoulder arthroplasty on 07/22/17.   Other history includes never smoker, post-operative N/V, GERD, hyperthyroidism (s/p radioactive iodine), possible TIA 05/17/16 (negative head CT 07/14/16).  She tolerated shoulder surgery in June. She denied SOB, cough, fever, and chest pain. If no acute changes then I anticipate that she can proceed as planned.   VS: BP (!) 148/75   Pulse 69   Temp 36.7 C   Resp 20   Ht 5\' 3"  (1.6 m)   Wt 62 kg   SpO2 98%   BMI 24.20 kg/m    PROVIDERS: Cheral Bay, MD is PCP - She was seen by cardiologist Merrily Pew, MD in 2014 for an abnormal stress test but subsequently had a cardiac cath (see below) that only showed minimal CAD.    LABS: Labs reviewed: Acceptable for surgery. (all labs ordered are listed, but only abnormal results are displayed)  Labs Reviewed  COMPREHENSIVE METABOLIC PANEL - Abnormal; Notable for the following components:      Result Value   Glucose, Bld 110 (*)    All other components within normal limits  URINALYSIS, ROUTINE W REFLEX MICROSCOPIC - Abnormal; Notable for the following components:   APPearance HAZY (*)    All other components within normal limits  SURGICAL PCR SCREEN  APTT  CBC WITH DIFFERENTIAL/PLATELET  PROTIME-INR  TYPE AND SCREEN    IMAGES: CTA head/neck 08/27/17 [facility] Everywhere; done during evaluation for febrile illness): IMPRESSION: 1. No acute intracranial abnormality.  2. No large vessel occlusion or high-grade stenosis. 3. Nonspecific sinus mucosal thickening as described  above.   CXR 08/27/17 [facility] Everywhere; done during evaluation for febrile illness): Findings and impression: - Normal heart size. Mildly tortuous thoracic aorta. - No focal opacity in the lungs. - Questionable increase in bilateral interstitial markings, which is likely related to radiographic technique. Although mild interstitial pulmonary edema is also a consideration. - Right shoulder arthroplasty noted.   EKG: 08/27/17 ([facility]): Normal sinus rhythm.  RSR prime in V1 or V2.  Prolonged QT (QT 434, QTc 471 ms).   CV: Echo 09/15/2016 [facility], scanned under Media tab, Correspondence 07/22/17): Conclusion: 1. Normal LV size and systolic function. EF 55-60% 2. Trace tricuspid regurgitation  Cardiac event monitor 09/15/16-10/14/16 [facility], scanned under Media tab, Correspondence 07/22/17):  1. Sinus rhythm 2. Rare PVCs  Carotid Duplex 07/17/16 [facility] Everywhere): Result Impression: Minimal amount of bilateral atherosclerotic plaque, not resulting in a hemodynamically significant stenosis within either internal carotid artery.  Cardiac cath 09/13/2012 [facility], scanned under Media tab, Correspondence 07/22/17): 1.  Mild CAD (proximal LAD 20%; RCA mild irregularity). 2.  Mild dilatation of mid RCA and LAD. 3.  Normal LV function.   Past Medical History:  Diagnosis Date  . Anemia    history of  . Anxiety   . Arthritis   . Depression   . Elevated cholesterol   . Endometrial polyp   . Family history of adverse reaction to anesthesia    sister  has nausea/vomiting  . GERD (gastroesophageal reflux disease)    occasionally  . Headache   . 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 Osteopenia 02/2017   T score -2.1 distal third left forearm  . Osteoporosis   . Pneumonia   . PONV (postoperative nausea and vomiting)   . TIA (transient ischemic attack) fall 2018   states it was possible     Past Surgical History:  Procedure Laterality Date  . abdominal lipoma    . APPENDECTOMY    . BREAST SURGERY  2011   radial scar removed  . DILATION AND CURETTAGE OF UTERUS    . HYSTEROSCOPY    . NASAL SINUS SURGERY    . NOSE SURGERY     plastic surgery  . SHOULDER SURGERY  30 years ago  . TONSILLECTOMY    . TOTAL HIP ARTHROPLASTY  2008-2012   Bilateral  . TOTAL HIP REVISION    . TOTAL SHOULDER ARTHROPLASTY Right 07/22/2017   Procedure: RIGHT TOTAL SHOULDER ARTHROPLASTY;  Surgeon: Jones Broom, MD;  Location: [facility] OR;  Service: Orthopedics;  Laterality: Right;  . TUBAL LIGATION      MEDICATIONS: . buPROPion (WELLBUTRIN XL) 150 MG 24 hr tablet  . Cholecalciferol (VITAMIN D) 2000 units tablet  . Coenzyme Q10 (COQ10) 100 MG CAPS  . docusate sodium (COLACE) 100 MG capsule  . Echinacea 400 MG CAPS  . ezetimibe (ZETIA) 10 MG tablet  . Ferrous Gluconate-C-Folic Acid (IRON-C PO)  . FLOVENT HFA 220 MCG/ACT inhaler  . fluticasone (FLONASE) 50 MCG/ACT nasal spray  . gabapentin (NEURONTIN) 300 MG capsule  . HYDROcodone-acetaminophen (NORCO/VICODIN) 5-325 MG tablet  . ibandronate (BONIVA) 150 MG tablet  . levothyroxine (SYNTHROID, LEVOTHROID) 75 MCG tablet  . meloxicam (MOBIC) 15 MG tablet  . methocarbamol (ROBAXIN) 500 MG tablet  . montelukast (SINGULAIR) 10 MG tablet  . omeprazole (PRILOSEC) 40 MG capsule  . oxyCODONE-acetaminophen (PERCOCET) 5-325 MG tablet  . PARoxetine (PAXIL) 20 MG tablet  . potassium chloride (K-DUR,KLOR-CON) 10 MEQ tablet  . rizatriptan (MAXALT) 10 MG tablet  . temazepam (RESTORIL) 30 MG capsule  . triamterene-hydrochlorothiazide (MAXZIDE-25) 37.5-25 MG per tablet  . vitamin C (ASCORBIC ACID) 500 MG tablet  . zinc gluconate 50 MG tablet   No current facility-administered medications for this encounter.     Velna Ochs [facility] Short Stay Center/Anesthesiology Phone [phone] 12/24/2017 9:29 AM

## 2017-12-24 NOTE — Anesthesia Preprocedure Evaluation (Addendum)
Anesthesia Evaluation  Patient identified by MRN, date of birth, ID band Patient awake    Reviewed: Allergy & Precautions, NPO status , Patient's Chart, lab work & pertinent test results  History of Anesthesia Complications (+) PONV and history of anesthetic complications  Airway Mallampati: I  TM Distance: >3 FB Neck ROM: Full    Dental no notable dental hx.    Pulmonary neg pulmonary ROS,    Pulmonary exam normal breath sounds clear to auscultation       Cardiovascular negative cardio ROS Normal cardiovascular exam Rhythm:Regular Rate:Normal  Cheral Bay, MD is PCP  She was seen by cardiologist Merrily Pew, MD in 2014 for an abnormal stress test but subsequently had a cardiac cath that only showed minimal CAD.   Echo 09/15/2016([facility], scanned under Media tab, Correspondence 07/22/17): Conclusion: 1. Normal LV size and systolic function. EF 55-60% 2. Trace tricuspid regurgitation   Neuro/Psych  Headaches, PSYCHIATRIC DISORDERS Anxiety Depression TIA   GI/Hepatic Neg liver ROS, GERD  Controlled,  Endo/Other  Hypothyroidism   Renal/GU negative Renal ROS     Musculoskeletal negative musculoskeletal ROS (+)   Abdominal   Peds  Hematology negative hematology ROS (+) HLD   Anesthesia Other Findings FAILED RIGHT TOTAL SHOULDER  Reproductive/Obstetrics                            Anesthesia Physical Anesthesia Plan  ASA: II  Anesthesia Plan: General and Regional   Post-op Pain Management: GA combined w/ Regional for post-op pain   Induction: Intravenous  PONV Risk Score and Plan: 4 or greater and Midazolam, Dexamethasone, Ondansetron and Treatment may vary due to age or medical condition  Airway Management Planned: Oral ETT  Additional Equipment:   Intra-op Plan:   Post-operative Plan: Extubation in OR  Informed Consent: I have reviewed the patients History  and Physical, chart, labs and discussed the procedure including the risks, benefits and alternatives for the proposed anesthesia with the patient or authorized representative who has indicated his/her understanding and acceptance.   Dental advisory given  Plan Discussed with: CRNA  Anesthesia Plan Comments: (Reviewed PAT note written 12/24/2017 by Shonna Chock, PA-C. )      Anesthesia Quick Evaluation

## 2017-12-29 MED ORDER — TRANEXAMIC ACID-NACL 1000-0.7 MG/100ML-% IV SOLN
1000.0000 mg | INTRAVENOUS | Status: AC
  Administered 2017-12-30: 1000 mg via INTRAVENOUS
  Filled 2017-12-29: qty 100

## 2017-12-30 ENCOUNTER — Inpatient Hospital Stay (HOSPITAL_COMMUNITY): Payer: Medicare Other | Admitting: Vascular Surgery

## 2017-12-30 ENCOUNTER — Inpatient Hospital Stay (HOSPITAL_COMMUNITY)
Admission: RE | Admit: 2017-12-30 | Discharge: 2017-12-31 | DRG: 483 | Disposition: A | Discharge status: Home / Self Care | Payer: Medicare Other | Source: Ambulatory Visit | Attending: Orthopedic Surgery | Admitting: Orthopedic Surgery

## 2017-12-30 ENCOUNTER — Inpatient Hospital Stay (HOSPITAL_COMMUNITY): Payer: Medicare Other | Admitting: Anesthesiology

## 2017-12-30 ENCOUNTER — Encounter (HOSPITAL_COMMUNITY)
Admission: RE | Disposition: A | Discharge status: Home / Self Care | Payer: Self-pay | Source: Ambulatory Visit | Attending: Orthopedic Surgery

## 2017-12-30 ENCOUNTER — Other Ambulatory Visit: Payer: Self-pay

## 2017-12-30 ENCOUNTER — Encounter (HOSPITAL_COMMUNITY): Payer: Self-pay | Admitting: Urology

## 2017-12-30 ENCOUNTER — Inpatient Hospital Stay (HOSPITAL_COMMUNITY): Payer: Medicare Other

## 2017-12-30 DIAGNOSIS — Z888 Allergy status to other drugs, medicaments and biological substances status: Secondary | ICD-10-CM

## 2017-12-30 DIAGNOSIS — F329 Major depressive disorder, single episode, unspecified: Secondary | ICD-10-CM | POA: Diagnosis present

## 2017-12-30 DIAGNOSIS — Z96611 Presence of right artificial shoulder joint: Secondary | ICD-10-CM

## 2017-12-30 DIAGNOSIS — E7800 Pure hypercholesterolemia, unspecified: Secondary | ICD-10-CM | POA: Diagnosis present

## 2017-12-30 DIAGNOSIS — T84028A Dislocation of other internal joint prosthesis, initial encounter: Secondary | ICD-10-CM | POA: Diagnosis present

## 2017-12-30 DIAGNOSIS — E0590 Thyrotoxicosis, unspecified without thyrotoxic crisis or storm: Secondary | ICD-10-CM | POA: Diagnosis present

## 2017-12-30 DIAGNOSIS — Y838 Other surgical procedures as the cause of abnormal reaction of the patient, or of later complication, without mention of misadventure at the time of the procedure: Secondary | ICD-10-CM | POA: Diagnosis present

## 2017-12-30 DIAGNOSIS — Z96643 Presence of artificial hip joint, bilateral: Secondary | ICD-10-CM | POA: Diagnosis present

## 2017-12-30 DIAGNOSIS — Z8673 Personal history of transient ischemic attack (TIA), and cerebral infarction without residual deficits: Secondary | ICD-10-CM

## 2017-12-30 DIAGNOSIS — E039 Hypothyroidism, unspecified: Secondary | ICD-10-CM | POA: Diagnosis present

## 2017-12-30 DIAGNOSIS — Z791 Long term (current) use of non-steroidal anti-inflammatories (NSAID): Secondary | ICD-10-CM | POA: Diagnosis not present

## 2017-12-30 DIAGNOSIS — I2510 Atherosclerotic heart disease of native coronary artery without angina pectoris: Secondary | ICD-10-CM | POA: Diagnosis present

## 2017-12-30 DIAGNOSIS — K219 Gastro-esophageal reflux disease without esophagitis: Secondary | ICD-10-CM | POA: Diagnosis present

## 2017-12-30 DIAGNOSIS — M810 Age-related osteoporosis without current pathological fracture: Secondary | ICD-10-CM | POA: Diagnosis present

## 2017-12-30 DIAGNOSIS — Z79899 Other long term (current) drug therapy: Secondary | ICD-10-CM | POA: Diagnosis not present

## 2017-12-30 HISTORY — PX: TOTAL SHOULDER REVISION: SHX6130

## 2017-12-30 HISTORY — PX: REVERSE TOTAL SHOULDER ARTHROPLASTY: SHX2344

## 2017-12-30 SURGERY — REVISION, TOTAL ARTHROPLASTY, SHOULDER
Anesthesia: Regional | Site: Shoulder | Laterality: Right

## 2017-12-30 MED ORDER — SODIUM CHLORIDE 0.9 % IV SOLN
INTRAVENOUS | Status: AC
  Administered 2017-12-30: 10:00:00 via INTRAVENOUS

## 2017-12-30 MED ORDER — BUDESONIDE 0.5 MG/2ML IN SUSP
1.0000 mg | Freq: Two times a day (BID) | RESPIRATORY_TRACT | Status: DC
  Administered 2017-12-30 – 2017-12-31 (×2): 1 mg via RESPIRATORY_TRACT
  Filled 2017-12-30 (×2): qty 4

## 2017-12-30 MED ORDER — ONDANSETRON HCL 4 MG PO TABS: 4.0000 mg | ORAL_TABLET | Freq: Four times a day (QID) | ORAL | Status: DC | PRN

## 2017-12-30 MED ORDER — METHOCARBAMOL 500 MG PO TABS
500.0000 mg | ORAL_TABLET | Freq: Four times a day (QID) | ORAL | Status: DC | PRN
  Administered 2017-12-31: 500 mg via ORAL
  Filled 2017-12-30: qty 1

## 2017-12-30 MED ORDER — ACETAMINOPHEN 325 MG PO TABS: 325.0000 mg | ORAL_TABLET | Freq: Four times a day (QID) | ORAL | Status: DC | PRN

## 2017-12-30 MED ORDER — METHOCARBAMOL 1000 MG/10ML IJ SOLN
500.0000 mg | Freq: Four times a day (QID) | INTRAVENOUS | Status: DC | PRN
  Filled 2017-12-30: qty 5

## 2017-12-30 MED ORDER — LIDOCAINE 2% (20 MG/ML) 5 ML SYRINGE
INTRAMUSCULAR | Status: DC | PRN
  Administered 2017-12-30: 100 mg via INTRAVENOUS

## 2017-12-30 MED ORDER — MIDAZOLAM HCL 5 MG/5ML IJ SOLN
INTRAMUSCULAR | Status: DC | PRN
  Administered 2017-12-30 (×2): 1 mg via INTRAVENOUS

## 2017-12-30 MED ORDER — LACTATED RINGERS IV SOLN
INTRAVENOUS | Status: DC | PRN
  Administered 2017-12-30: 07:00:00 via INTRAVENOUS

## 2017-12-30 MED ORDER — PAROXETINE HCL 20 MG PO TABS
20.0000 mg | ORAL_TABLET | Freq: Every day | ORAL | Status: DC
  Administered 2017-12-31: 20 mg via ORAL
  Filled 2017-12-30: qty 1

## 2017-12-30 MED ORDER — CEFAZOLIN SODIUM-DEXTROSE 1-4 GM/50ML-% IV SOLN
1.0000 g | Freq: Four times a day (QID) | INTRAVENOUS | Status: AC
  Administered 2017-12-30 – 2017-12-31 (×3): 1 g via INTRAVENOUS
  Filled 2017-12-30 (×3): qty 50

## 2017-12-30 MED ORDER — ONDANSETRON HCL 4 MG/2ML IJ SOLN: 4.0000 mg | Freq: Once | INTRAMUSCULAR | Status: DC | PRN

## 2017-12-30 MED ORDER — BISACODYL 5 MG PO TBEC: 5.0000 mg | DELAYED_RELEASE_TABLET | Freq: Every day | ORAL | Status: DC | PRN

## 2017-12-30 MED ORDER — DOCUSATE SODIUM 100 MG PO CAPS
100.0000 mg | ORAL_CAPSULE | Freq: Two times a day (BID) | ORAL | Status: DC
  Administered 2017-12-30 – 2017-12-31 (×3): 100 mg via ORAL
  Filled 2017-12-30 (×3): qty 1

## 2017-12-30 MED ORDER — SODIUM CHLORIDE 0.9 % IR SOLN
Status: DC | PRN
  Administered 2017-12-30: 3000 mL

## 2017-12-30 MED ORDER — PROPOFOL 10 MG/ML IV BOLUS
INTRAVENOUS | Status: AC
  Filled 2017-12-30: qty 20

## 2017-12-30 MED ORDER — HYDROMORPHONE HCL 1 MG/ML IJ SOLN: 0.5000 mg | INTRAMUSCULAR | Status: DC | PRN

## 2017-12-30 MED ORDER — FENTANYL CITRATE (PF) 250 MCG/5ML IJ SOLN
INTRAMUSCULAR | Status: DC | PRN
  Administered 2017-12-30 (×2): 50 ug via INTRAVENOUS

## 2017-12-30 MED ORDER — 0.9 % SODIUM CHLORIDE (POUR BTL) OPTIME
TOPICAL | Status: DC | PRN
  Administered 2017-12-30: 1000 mL

## 2017-12-30 MED ORDER — ONDANSETRON HCL 4 MG/2ML IJ SOLN
INTRAMUSCULAR | Status: DC | PRN
  Administered 2017-12-30: 4 mg via INTRAVENOUS

## 2017-12-30 MED ORDER — ROCURONIUM BROMIDE 10 MG/ML (PF) SYRINGE: PREFILLED_SYRINGE | INTRAVENOUS | Status: DC | PRN

## 2017-12-30 MED ORDER — LIDOCAINE 2% (20 MG/ML) 5 ML SYRINGE
INTRAMUSCULAR | Status: AC
  Filled 2017-12-30: qty 5

## 2017-12-30 MED ORDER — PHENYLEPHRINE 40 MCG/ML (10ML) SYRINGE FOR IV PUSH (FOR BLOOD PRESSURE SUPPORT)
PREFILLED_SYRINGE | INTRAVENOUS | Status: DC | PRN
  Administered 2017-12-30: 80 ug via INTRAVENOUS

## 2017-12-30 MED ORDER — CEFAZOLIN SODIUM-DEXTROSE 2-4 GM/100ML-% IV SOLN
2.0000 g | INTRAVENOUS | Status: AC
  Administered 2017-12-30: 2 g via INTRAVENOUS
  Filled 2017-12-30: qty 100

## 2017-12-30 MED ORDER — BUPIVACAINE LIPOSOME 1.3 % IJ SUSP
INTRAMUSCULAR | Status: DC | PRN
  Administered 2017-12-30: 10 mL via PERINEURAL

## 2017-12-30 MED ORDER — ASPIRIN EC 81 MG PO TBEC
81.0000 mg | DELAYED_RELEASE_TABLET | Freq: Two times a day (BID) | ORAL | Status: DC
  Administered 2017-12-30 – 2017-12-31 (×2): 81 mg via ORAL
  Filled 2017-12-30 (×2): qty 1

## 2017-12-30 MED ORDER — ONDANSETRON HCL 4 MG/2ML IJ SOLN
INTRAMUSCULAR | Status: AC
  Filled 2017-12-30: qty 2

## 2017-12-30 MED ORDER — ACETAMINOPHEN 500 MG PO TABS
1000.0000 mg | ORAL_TABLET | Freq: Four times a day (QID) | ORAL | Status: DC
  Administered 2017-12-30 (×2): 1000 mg via ORAL
  Filled 2017-12-30 (×2): qty 2

## 2017-12-30 MED ORDER — ROCURONIUM BROMIDE 50 MG/5ML IV SOSY
PREFILLED_SYRINGE | INTRAVENOUS | Status: AC
  Filled 2017-12-30: qty 5

## 2017-12-30 MED ORDER — GABAPENTIN 300 MG PO CAPS
300.0000 mg | ORAL_CAPSULE | Freq: Three times a day (TID) | ORAL | Status: DC
  Administered 2017-12-30 – 2017-12-31 (×3): 300 mg via ORAL
  Filled 2017-12-30 (×3): qty 1

## 2017-12-30 MED ORDER — ROCURONIUM BROMIDE 50 MG/5ML IV SOSY
PREFILLED_SYRINGE | INTRAVENOUS | Status: DC | PRN
  Administered 2017-12-30: 40 mg via INTRAVENOUS

## 2017-12-30 MED ORDER — OXYCODONE HCL 5 MG PO TABS
5.0000 mg | ORAL_TABLET | ORAL | Status: DC | PRN
  Administered 2017-12-30: 5 mg via ORAL
  Administered 2017-12-31 (×2): 10 mg via ORAL
  Filled 2017-12-30 (×2): qty 2

## 2017-12-30 MED ORDER — FENTANYL CITRATE (PF) 250 MCG/5ML IJ SOLN
INTRAMUSCULAR | Status: AC
  Filled 2017-12-30: qty 5

## 2017-12-30 MED ORDER — PHENOL 1.4 % MT LIQD: 1.0000 | OROMUCOSAL | Status: DC | PRN

## 2017-12-30 MED ORDER — BUPIVACAINE HCL (PF) 0.5 % IJ SOLN
INTRAMUSCULAR | Status: DC | PRN
  Administered 2017-12-30: 15 mL via PERINEURAL

## 2017-12-30 MED ORDER — SUGAMMADEX SODIUM 200 MG/2ML IV SOLN
INTRAVENOUS | Status: DC | PRN
  Administered 2017-12-30: 124 mg via INTRAVENOUS

## 2017-12-30 MED ORDER — TEMAZEPAM 15 MG PO CAPS
30.0000 mg | ORAL_CAPSULE | Freq: Every day | ORAL | Status: DC
  Administered 2017-12-30: 30 mg via ORAL
  Filled 2017-12-30: qty 2

## 2017-12-30 MED ORDER — MENTHOL 3 MG MT LOZG: 1.0000 | LOZENGE | OROMUCOSAL | Status: DC | PRN

## 2017-12-30 MED ORDER — FLEET ENEMA 7-19 GM/118ML RE ENEM: 1.0000 | ENEMA | Freq: Once | RECTAL | Status: DC | PRN

## 2017-12-30 MED ORDER — MIDAZOLAM HCL 2 MG/2ML IJ SOLN
INTRAMUSCULAR | Status: AC
  Filled 2017-12-30: qty 2

## 2017-12-30 MED ORDER — SUCCINYLCHOLINE CHLORIDE 200 MG/10ML IV SOSY
PREFILLED_SYRINGE | INTRAVENOUS | Status: AC
  Filled 2017-12-30: qty 10

## 2017-12-30 MED ORDER — SODIUM CHLORIDE 0.9 % IV SOLN
INTRAVENOUS | Status: DC | PRN
  Administered 2017-12-30: 50 ug/min via INTRAVENOUS

## 2017-12-30 MED ORDER — POVIDONE-IODINE 7.5 % EX SOLN
Freq: Once | CUTANEOUS | Status: DC
  Filled 2017-12-30: qty 118

## 2017-12-30 MED ORDER — METOCLOPRAMIDE HCL 5 MG PO TABS: 5.0000 mg | ORAL_TABLET | Freq: Three times a day (TID) | ORAL | Status: DC | PRN

## 2017-12-30 MED ORDER — BUPROPION HCL ER (XL) 150 MG PO TB24
150.0000 mg | ORAL_TABLET | Freq: Every day | ORAL | Status: DC
  Administered 2017-12-31: 150 mg via ORAL
  Filled 2017-12-30: qty 1

## 2017-12-30 MED ORDER — OXYCODONE HCL 5 MG PO TABS
10.0000 mg | ORAL_TABLET | ORAL | Status: DC | PRN
  Filled 2017-12-30: qty 2

## 2017-12-30 MED ORDER — METOCLOPRAMIDE HCL 5 MG/ML IJ SOLN: 5.0000 mg | Freq: Three times a day (TID) | INTRAMUSCULAR | Status: DC | PRN

## 2017-12-30 MED ORDER — HEMOSTATIC AGENTS (NO CHARGE) OPTIME
TOPICAL | Status: DC | PRN
  Administered 2017-12-30: 1 via TOPICAL

## 2017-12-30 MED ORDER — LEVOTHYROXINE SODIUM 75 MCG PO TABS
75.0000 ug | ORAL_TABLET | Freq: Every day | ORAL | Status: DC
  Administered 2017-12-31: 75 ug via ORAL
  Filled 2017-12-30: qty 1

## 2017-12-30 MED ORDER — ONDANSETRON HCL 4 MG/2ML IJ SOLN: 4.0000 mg | Freq: Four times a day (QID) | INTRAMUSCULAR | Status: DC | PRN

## 2017-12-30 MED ORDER — DEXAMETHASONE SODIUM PHOSPHATE 10 MG/ML IJ SOLN
INTRAMUSCULAR | Status: AC
  Filled 2017-12-30: qty 1

## 2017-12-30 MED ORDER — ALUM & MAG HYDROXIDE-SIMETH 200-200-20 MG/5ML PO SUSP: 30.0000 mL | ORAL | Status: DC | PRN

## 2017-12-30 MED ORDER — MONTELUKAST SODIUM 10 MG PO TABS
10.0000 mg | ORAL_TABLET | Freq: Every day | ORAL | Status: DC
  Administered 2017-12-30: 10 mg via ORAL
  Filled 2017-12-30: qty 1

## 2017-12-30 MED ORDER — EZETIMIBE 10 MG PO TABS
10.0000 mg | ORAL_TABLET | Freq: Every day | ORAL | Status: DC
  Administered 2017-12-31: 10 mg via ORAL
  Filled 2017-12-30: qty 1

## 2017-12-30 MED ORDER — DEXAMETHASONE SODIUM PHOSPHATE 10 MG/ML IJ SOLN
INTRAMUSCULAR | Status: DC | PRN
  Administered 2017-12-30: 10 mg via INTRAVENOUS

## 2017-12-30 MED ORDER — PROPOFOL 10 MG/ML IV BOLUS
INTRAVENOUS | Status: DC | PRN
  Administered 2017-12-30: 100 mg via INTRAVENOUS

## 2017-12-30 MED ORDER — POLYETHYLENE GLYCOL 3350 17 G PO PACK: 17.0000 g | PACK | Freq: Every day | ORAL | Status: DC | PRN

## 2017-12-30 MED ORDER — PHENYLEPHRINE 40 MCG/ML (10ML) SYRINGE FOR IV PUSH (FOR BLOOD PRESSURE SUPPORT)
PREFILLED_SYRINGE | INTRAVENOUS | Status: AC
  Filled 2017-12-30: qty 10

## 2017-12-30 MED ORDER — SUCCINYLCHOLINE CHLORIDE 200 MG/10ML IV SOSY
PREFILLED_SYRINGE | INTRAVENOUS | Status: DC | PRN
  Administered 2017-12-30: 100 mg via INTRAVENOUS

## 2017-12-30 MED ORDER — TRIAMTERENE-HCTZ 37.5-25 MG PO TABS
1.0000 | ORAL_TABLET | Freq: Two times a day (BID) | ORAL | Status: DC
  Administered 2017-12-30 – 2017-12-31 (×2): 1 via ORAL
  Filled 2017-12-30 (×2): qty 1

## 2017-12-30 MED ORDER — DIPHENHYDRAMINE HCL 12.5 MG/5ML PO ELIX: 12.5000 mg | ORAL_SOLUTION | ORAL | Status: DC | PRN

## 2017-12-30 MED ORDER — FENTANYL CITRATE (PF) 100 MCG/2ML IJ SOLN: 25.0000 ug | INTRAMUSCULAR | Status: DC | PRN

## 2017-12-30 SURGICAL SUPPLY — 87 items
BASEPLATE P2 COATD GLND 6.5X30 (Shoulder) IMPLANT
BIT DRILL 2.5 DIA 127 CALI (BIT) ×2 IMPLANT
BIT DRILL 4 DIA CALIBRATED (BIT) ×2 IMPLANT
BLADE SAW SAG 73X25 THK (BLADE)
BLADE SAW SGTL 73X25 THK (BLADE) ×1 IMPLANT
BLADE SURG 15 STRL LF DISP TIS (BLADE) ×1 IMPLANT
BLADE SURG 15 STRL SS (BLADE) ×3
BONE CHIP PRESERV 40CC PCAN1/2 (Bone Implant) ×3 IMPLANT
BOWL SMART MIX CTS (DISPOSABLE) IMPLANT
BSPLAT GLND 30 STRL LF SHLDR (Shoulder) ×1 IMPLANT
CHLORAPREP W/TINT 26ML (MISCELLANEOUS) ×3 IMPLANT
CLOSURE WOUND 1/2 X4 (GAUZE/BANDAGES/DRESSINGS) ×1
COVER SURGICAL LIGHT HANDLE (MISCELLANEOUS) ×3 IMPLANT
COVER WAND RF STERILE (DRAPES) ×3 IMPLANT
DRAPE INCISE IOBAN 66X45 STRL (DRAPES) ×3 IMPLANT
DRAPE SURG 17X23 STRL (DRAPES) ×3 IMPLANT
DRAPE U-SHAPE 47X51 STRL (DRAPES) ×3 IMPLANT
DRESSING AQUACEL AG SP 3.5X6 (GAUZE/BANDAGES/DRESSINGS) IMPLANT
DRSG ADAPTIC 3X8 NADH LF (GAUZE/BANDAGES/DRESSINGS) ×3 IMPLANT
DRSG AQUACEL AG SP 3.5X6 (GAUZE/BANDAGES/DRESSINGS) ×3
DRSG PAD ABDOMINAL 8X10 ST (GAUZE/BANDAGES/DRESSINGS) ×6 IMPLANT
ELECT BLADE 4.0 EZ CLEAN MEGAD (MISCELLANEOUS) ×3
ELECT REM PT RETURN 9FT ADLT (ELECTROSURGICAL) ×3
ELECTRODE BLDE 4.0 EZ CLN MEGD (MISCELLANEOUS) ×1 IMPLANT
ELECTRODE REM PT RTRN 9FT ADLT (ELECTROSURGICAL) ×1 IMPLANT
EVACUATOR 1/8 PVC DRAIN (DRAIN) ×1 IMPLANT
GAUZE SPONGE 4X4 12PLY STRL (GAUZE/BANDAGES/DRESSINGS) ×3 IMPLANT
GLOVE BIO SURGEON STRL SZ7 (GLOVE) ×5 IMPLANT
GLOVE BIO SURGEON STRL SZ7.5 (GLOVE) ×3 IMPLANT
GLOVE BIOGEL PI IND STRL 8 (GLOVE) ×1 IMPLANT
GLOVE BIOGEL PI INDICATOR 8 (GLOVE) ×2
GOWN STRL REUS W/ TWL LRG LVL3 (GOWN DISPOSABLE) ×3 IMPLANT
GOWN STRL REUS W/ TWL XL LVL3 (GOWN DISPOSABLE) ×2 IMPLANT
GOWN STRL REUS W/TWL LRG LVL3 (GOWN DISPOSABLE) ×12
GOWN STRL REUS W/TWL XL LVL3 (GOWN DISPOSABLE) ×6
GRAFT BNE CANC CHIPS 1-8 40CC (Bone Implant) IMPLANT
HANDPIECE INTERPULSE COAX TIP (DISPOSABLE) ×3
HEMOSTAT SURGICEL 2X14 (HEMOSTASIS) ×3 IMPLANT
HOOD PEEL AWAY FACE SHEILD DIS (HOOD) ×6 IMPLANT
INSERT SMALL SOCKET 32MM NEU (Insert) ×2 IMPLANT
KIT BASIN OR (CUSTOM PROCEDURE TRAY) ×3 IMPLANT
KIT TURNOVER KIT B (KITS) ×3 IMPLANT
MANIFOLD NEPTUNE II (INSTRUMENTS) ×3 IMPLANT
NDL HYPO 25GX1X1/2 BEV (NEEDLE) ×1 IMPLANT
NDL MAYO TROCAR (NEEDLE) ×1 IMPLANT
NEEDLE HYPO 25GX1X1/2 BEV (NEEDLE) IMPLANT
NEEDLE MAYO TROCAR (NEEDLE) IMPLANT
NS IRRIG 1000ML POUR BTL (IV SOLUTION) ×3 IMPLANT
P2 COATDE GLNOID BSEPLT 6.5X30 (Shoulder) ×3 IMPLANT
PACK SHOULDER (CUSTOM PROCEDURE TRAY) ×3 IMPLANT
PAD ARMBOARD 7.5X6 YLW CONV (MISCELLANEOUS) ×6 IMPLANT
RETRIEVER SUT HEWSON (MISCELLANEOUS) IMPLANT
SCREW BONE LOCKING RSP 5.0X14 (Screw) ×3 IMPLANT
SCREW BONE LOCKING RSP 5.0X30 (Screw) ×3 IMPLANT
SCREW BONE RSP LOCK 5X14 (Screw) IMPLANT
SCREW BONE RSP LOCK 5X18 (Screw) IMPLANT
SCREW BONE RSP LOCK 5X26 (Screw) IMPLANT
SCREW BONE RSP LOCK 5X30 (Screw) IMPLANT
SCREW BONE RSP LOCKING 18MM LG (Screw) ×3 IMPLANT
SCREW BONE RSP LOCKING 5.0X26 (Screw) ×3 IMPLANT
SCREW RETAIN W/HEAD 4MM OFFSET (Shoulder) ×2 IMPLANT
SET HNDPC FAN SPRY TIP SCT (DISPOSABLE) ×1 IMPLANT
SLING ARM IMMOBILIZER LRG (SOFTGOODS) ×3 IMPLANT
SLING ARM IMMOBILIZER MED (SOFTGOODS) ×2 IMPLANT
SMARTMIX MINI TOWER (MISCELLANEOUS)
SPONGE LAP 18X18 X RAY DECT (DISPOSABLE) ×3 IMPLANT
SPONGE LAP 4X18 RFD (DISPOSABLE) ×9 IMPLANT
STEM HUMERAL 8X108MM SMALL (Stem) ×2 IMPLANT
STRIP CLOSURE SKIN 1/2X4 (GAUZE/BANDAGES/DRESSINGS) ×2 IMPLANT
SUCTION FRAZIER HANDLE 10FR (MISCELLANEOUS) ×2
SUCTION TUBE FRAZIER 10FR DISP (MISCELLANEOUS) ×1 IMPLANT
SUPPORT WRAP ARM LG (MISCELLANEOUS) ×3 IMPLANT
SUT ETHIBOND NAB CT1 #1 30IN (SUTURE) ×5 IMPLANT
SUT FIBERWIRE #2 38 T-5 BLUE (SUTURE)
SUT MNCRL AB 4-0 PS2 18 (SUTURE) ×3 IMPLANT
SUT SILK 2 0 TIES 17X18 (SUTURE) ×3
SUT SILK 2-0 18XBRD TIE BLK (SUTURE) ×1 IMPLANT
SUT VIC AB 0 CTB1 27 (SUTURE) ×3 IMPLANT
SUT VIC AB 2-0 CT1 27 (SUTURE) ×3
SUT VIC AB 2-0 CT1 TAPERPNT 27 (SUTURE) ×1 IMPLANT
SUTURE FIBERWR #2 38 T-5 BLUE (SUTURE) ×1 IMPLANT
SYR 30ML LL (SYRINGE) ×3 IMPLANT
TOWEL OR 17X24 6PK STRL BLUE (TOWEL DISPOSABLE) ×3 IMPLANT
TOWEL OR 17X26 10 PK STRL BLUE (TOWEL DISPOSABLE) ×3 IMPLANT
TOWER SMARTMIX MINI (MISCELLANEOUS) ×1 IMPLANT
WATER STERILE IRR 1000ML POUR (IV SOLUTION) ×3 IMPLANT
YANKAUER SUCT BULB TIP NO VENT (SUCTIONS) ×3 IMPLANT

## 2017-12-30 NOTE — Anesthesia Procedure Notes (Signed)
Procedure Name: Intubation Date/Time: 12/30/2017 7:45 AM Performed by: Renato Shin, CRNA Pre-anesthesia Checklist: Patient identified, Emergency Drugs available, Suction available and Patient being monitored Patient Re-evaluated:Patient Re-evaluated prior to induction Oxygen Delivery Method: Circle system utilized Preoxygenation: Pre-oxygenation with 100% oxygen Induction Type: IV induction Ventilation: Mask ventilation without difficulty Laryngoscope Size: Mac and 3 Grade View: Grade I Tube type: Oral Tube size: 7.0 mm Number of attempts: 1 Airway Equipment and Method: Stylet Placement Confirmation: positive ETCO2,  ETT inserted through vocal cords under direct vision,  breath sounds checked- equal and bilateral and CO2 detector Secured at: 23 cm Tube secured with: Tape Dental Injury: Teeth and Oropharynx as per pre-operative assessment  Comments: Blase Mess, SRNA

## 2017-12-30 NOTE — Anesthesia Postprocedure Evaluation (Signed)
Anesthesia Post Note  [name]  Procedure(s) Performed: RIGHT TOTAL SHOULDER REVISION TO REVERSE SHOULDER TO REVERSE ARHTROPLASTY (Right Shoulder)     Patient location during evaluation: PACU Anesthesia Type: Regional and General Level of consciousness: awake and alert Pain management: pain level controlled Vital Signs Assessment: post-procedure vital signs reviewed and stable Respiratory status: spontaneous breathing, nonlabored ventilation, respiratory function stable and patient connected to nasal cannula oxygen Cardiovascular status: blood pressure returned to baseline and stable Postop Assessment: no apparent nausea or vomiting Anesthetic complications: no    Last Vitals:  Vitals:   12/30/17 1118 12/30/17 1119  BP: 105/69   Pulse: 72   Resp:  18  Temp: 36.6 C   SpO2: 92%     Last Pain:  Vitals:   12/30/17 1200  TempSrc:   PainSc: 0-No pain                 Dante Roudebush P Tayari Yankee

## 2017-12-30 NOTE — Transfer of Care (Signed)
Immediate Anesthesia Transfer of Care Note  [facility]  Procedure(s) Performed: RIGHT TOTAL SHOULDER REVISION TO REVERSE SHOULDER TO REVERSE ARHTROPLASTY (Right Shoulder)  Patient Location: PACU  Anesthesia Type:GA combined with regional for post-op pain  Level of Consciousness: awake, drowsy and patient cooperative  Airway & Oxygen Therapy: Patient Spontanous Breathing and Patient connected to face mask oxygen  Post-op Assessment: Report given to RN and Post -op Vital signs reviewed and stable  Post vital signs: Reviewed and stable  Last Vitals:  Vitals Value Taken Time  BP 135/124 12/30/2017  9:56 AM  Temp    Pulse 82 12/30/2017  9:56 AM  Resp 16 12/30/2017  9:56 AM  SpO2 100 % 12/30/2017  9:56 AM  Vitals shown include unvalidated device data.  Last Pain:  Vitals:   12/30/17 [id number]  TempSrc: Oral  PainSc:       Patients Stated Pain Goal: 3 (12/30/17 0604)  Complications: No apparent anesthesia complications

## 2017-12-30 NOTE — Discharge Instructions (Signed)

## 2017-12-30 NOTE — Plan of Care (Signed)

## 2017-12-30 NOTE — Anesthesia Procedure Notes (Signed)
Anesthesia Regional Block: Interscalene brachial plexus block   Pre-Anesthetic Checklist: ,, timeout performed, Correct Patient, Correct Site, Correct Laterality, Correct Procedure, Correct Position, site marked, Risks and benefits discussed,  Surgical consent,  Pre-op evaluation,  At surgeon's request and post-op pain management  Laterality: Right  Prep: chloraprep       Needles:  Injection technique: Single-shot  Needle Type: Echogenic Stimulator Needle     Needle Length: 9cm  Needle Gauge: 21     Additional Needles:   Procedures:,,,, ultrasound used (permanent image in chart),,,,  Narrative:  Start time: 12/30/2017 6:50 AM End time: 12/30/2017 7:00 AM Injection made incrementally with aspirations every 5 mL.  Performed by: Personally  Anesthesiologist: Leonides GrillsEllender, Ryan P, MD  Additional Notes: Functioning IV was confirmed and monitors were applied.  A timeout was performed. Sterile prep, hand hygiene and sterile gloves were used. A 90mm 21ga Arrow echogenic stimulator needle was used. Negative aspiration and negative test dose prior to incremental administration of local anesthetic. The patient tolerated the procedure well.  Ultrasound guidance: relevent anatomy identified, needle position confirmed, local anesthetic spread visualized around nerve(s), vascular puncture avoided.  Image printed for medical record.

## 2017-12-30 NOTE — Op Note (Signed)
Procedure(s):  Procedure Note  Sarah Villarreal female 67 y.o. 12/30/2017  Procedure(s) and Anesthesia Type:    * RIGHT TOTAL SHOULDER REVISION TO REVERSE SHOULDER ARTHROPLASTY - Choice   Indications:  68 y.o. 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  Indicated for revision to reverse shoulder arthroplasty to try to decrease pain and restore function.     Surgeon: Berline Lopes   Assistants: Damita Lack PA-C [facility] was present and scrubbed throughout the procedure and was essential in positioning, retraction, exposure, and closure)  Anesthesia: General endotracheal anesthesia with preoperative interscalene block given by the attending anesthesiologist    Procedure Detail  RIGHT TOTAL SHOULDER REVISION TO REVERSE SHOULDER ARHTROPLASTY   Estimated Blood Loss:  200 mL         Drains: none  Blood Given: none          Specimens: none        Complications:  * No complications entered in OR log *         Disposition: PACU - hemodynamically stable.         Condition: stable      OPERATIVE FINDINGS:  The Tornier implant was removed without significant difficulty.  A DJO Altivate pressfit reverse total shoulder arthroplasty was placed with a  size 8 stem, a 32-4 glenosphere, and a standard poly insert. The base plate  fixation was good.  Combination of autograft and allograft bone was used to graft the glenoid defects and when inserting the press-fit reverse stem.  PROCEDURE: The patient was identified in the preoperative holding area  where I personally marked the operative site after verifying site, side,  and procedure with the patient. An interscalene block given by  the attending anesthesiologist in the holding area and the patient was taken back to the operating room where all extremities were  carefully padded in position after general anesthesia was  induced. She  was placed in a beach-chair position and the operative upper extremity was  prepped and draped in a standard sterile fashion. An approximately 10-  cm incision was made from the tip of the coracoid process to the center  point of the humerus at the level of the axilla. Dissection was carried  down through subcutaneous tissues to the level of the cephalic vein  which was taken laterally with the deltoid.  There was a fair amount of scar tissue.  The pectoralis major was  retracted medially. The subdeltoid space was developed and the lateral  edge of the conjoined tendon was identified.   The subscapularis was chronically torn and retracted.  The small lateral plate was removed along with the sutures.  The  joint was then gently externally rotated while the capsule was released  from the humeral neck around to just beyond the 6 o'clock position. At  this point, the joint was dislocated and the humeral head was presented  into the wound.  The humeral head was removed without difficulty.  There were no signs of infection throughout the case.  The proximal humerus was retracted posteriorly exposing the glenoid.  The glenoid was not grossly loose.  Using a small Cobb I was able to circumferentially free it up and then pry it loose.  This was removed without difficulty. A small drill was then used to drill out a small amount of remaining bone cement in the peripheral holes.  Curette was used as well.  The central  hole was then drilled for the reverse and the central tap was placed with good bicortical bite.  The reamer was used sparingly.  The peripheral holes and the remaining portion of the central hole from the previous implant was bone grafted with cancellus bone chips as well as some of the autograft obtained from reaming.  The central tap was removed and the implant was placed with good fixation and compression.  The peripheral guide was then used to drill measure and fill with the  appropriate size peripheral locking screws.  The 36 glenosphere was placed as a trial and then the proximal humerus was again exposed.  The proximal humeral fitting for the Tornier reverse shoulder was placed and attempted to be reduced but was too tight to reduce.  The decision was made to remove the Tornier stem.  The stem was cleared of proximal bony adhesions and then a broach handle was used to extract the stem without significant difficulty.  The metaphysis was then reamed and trial DJ O component was placed.  The reduction was satisfactory in terms of tension and stability.  The final implants were opened and a 8 stem with a small humeral tray and standard 32 poly-was placed with bone grafting and press-fit technique.  Final construct was very stable and had excellent range of motion.  The joint was then copiously irrigated with pulse  lavage and the wound was then closed. The subscapularis was not repaired.  Skin was closed with 2-0 Vicryl in a deep dermal layer and 4-0  Monocryl for skin closure. Steri-Strips were applied. Sterile  dressings were then applied as well as a sling. The patient was allowed  to awaken from general anesthesia, transferred to stretcher, and taken  to recovery room in stable condition.   POSTOPERATIVE PLAN: The patient will be kept in the hospital postoperatively  for pain control and therapy.

## 2017-12-30 NOTE — H&P (Signed)
Sarah Villarreal is an 67 y.o. female.   Chief Complaint: R shoulder dysfunction after TSA HPI: S/p R TSA with subscapularis failure.  Indicated for revision to reverse shoulder to improve function.  Past Medical History:  Diagnosis Date  . Anemia    history of  . Anxiety   . Arthritis   . Depression   . Elevated cholesterol   . Endometrial polyp   . Family history of adverse reaction to anesthesia    sister has nausea/vomiting  . GERD (gastroesophageal reflux disease)    occasionally  . Headache   . 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 Osteopenia 02/2017   T score -2.1 distal third left forearm  . Osteoporosis   . Pneumonia   . PONV (postoperative nausea and vomiting)   . TIA (transient ischemic attack) fall 2018   states it was possible    Past Surgical History:  Procedure Laterality Date  . abdominal lipoma    . APPENDECTOMY    . BREAST SURGERY  2011   radial scar removed  . DILATION AND CURETTAGE OF UTERUS    . HYSTEROSCOPY    . NASAL SINUS SURGERY    . NOSE SURGERY     plastic surgery  . SHOULDER SURGERY  30 years ago  . TONSILLECTOMY    . TOTAL HIP ARTHROPLASTY  2008-2012   Bilateral  . TOTAL HIP REVISION    . TOTAL SHOULDER ARTHROPLASTY Right 07/22/2017   Procedure: RIGHT TOTAL SHOULDER ARTHROPLASTY;  Surgeon: Jones Broomhandler, Riaz Onorato, MD;  Location: [facility] OR;  Service: Orthopedics;  Laterality: Right;  . TUBAL LIGATION      Family History  Problem Relation Age of Onset  . Lung cancer Mother    Social History:  reports that she has never smoked. She has never used smokeless tobacco. She reports that she drinks alcohol. She reports that she does not use drugs.  Allergies:  Allergies  Allergen Reactions  . Onion Nausea And Vomiting  . Adhesive [Tape] Dermatitis    Certain band aids cause skin irritation   . Meperidine Hcl Nausea And Vomiting    Medications Prior to Admission  Medication Sig Dispense Refill  .  buPROPion (WELLBUTRIN XL) 150 MG 24 hr tablet Take 150 mg by mouth daily.    . Cholecalciferol (VITAMIN D) 2000 units tablet Take 2,000 Units by mouth 2 (two) times daily.     . Coenzyme Q10 (COQ10) 100 MG CAPS Take 100 mg by mouth at bedtime.     . Echinacea 400 MG CAPS Take 400 mg by mouth daily.     Marland Kitchen. ezetimibe (ZETIA) 10 MG tablet Take 10 mg by mouth daily.    . Ferrous Gluconate-C-Folic Acid (IRON-C PO) Take 1 tablet by mouth daily.    Marland Kitchen. FLOVENT HFA 220 MCG/ACT inhaler Inhale 1 puff into the lungs 2 (two) times daily.  2  . gabapentin (NEURONTIN) 300 MG capsule Take 300 mg by mouth 3 (three) times daily.     Marland Kitchen. ibandronate (BONIVA) 150 MG tablet Take 150 mg by mouth every 30 (thirty) days. Take in the morning with a full glass of water, on an empty stomach, and do not take anything else by mouth or lie down for the next 30 min.    Marland Kitchen. levothyroxine (SYNTHROID, LEVOTHROID) 75 MCG tablet Take 75 mcg by mouth daily before breakfast.     . meloxicam (MOBIC) 15 MG tablet Take 15 mg by mouth daily.    .Marland Kitchen  montelukast (SINGULAIR) 10 MG tablet Take 10 mg by mouth at bedtime.  1  . PARoxetine (PAXIL) 20 MG tablet Take 20 mg by mouth every morning.      . potassium chloride (K-DUR,KLOR-CON) 10 MEQ tablet Take 20 mEq by mouth 2 (two) times daily.    Marland Kitchen triamterene-hydrochlorothiazide (MAXZIDE-25) 37.5-25 MG per tablet TAKE ONE TABLET BY MOUTH DAILY (Patient taking differently: Take 1 tablet by mouth 2 (two) times daily. ) 30 tablet 0  . vitamin C (ASCORBIC ACID) 500 MG tablet Take 500 mg by mouth daily.    Marland Kitchen zinc gluconate 50 MG tablet Take 50 mg by mouth daily.    Marland Kitchen docusate sodium (COLACE) 100 MG capsule Take 1 capsule (100 mg total) by mouth 3 (three) times daily as needed. (Patient not taking: Reported on 12/20/2017) 20 capsule 0  . fluticasone (FLONASE) 50 MCG/ACT nasal spray Place 2 sprays into both nostrils 2 (two) times daily.    Marland Kitchen HYDROcodone-acetaminophen (NORCO/VICODIN) 5-325 MG tablet Take 1 tablet  by mouth daily as needed for severe pain.  0  . methocarbamol (ROBAXIN) 500 MG tablet Take 1 tablet (500 mg total) by mouth 3 (three) times daily. (Patient not taking: Reported on 12/20/2017) 30 tablet 0  . omeprazole (PRILOSEC) 40 MG capsule Take 40 mg by mouth daily as needed (heart burn).     Marland Kitchen oxyCODONE-acetaminophen (PERCOCET) 5-325 MG tablet Take 1 tablet by mouth every 4 (four) hours as needed for severe pain. (Patient not taking: Reported on 12/20/2017) 20 tablet 0  . rizatriptan (MAXALT) 10 MG tablet Take 10 mg by mouth every 2 (two) hours as needed for migraine.   0  . temazepam (RESTORIL) 30 MG capsule Take 30 mg by mouth at bedtime.       No results found for this or any previous visit (from the past 48 hour(s)). No results found.  Review of Systems  All other systems reviewed and are negative.   Blood pressure 128/64, pulse 69, temperature 98.4 F (36.9 C), temperature source Oral, resp. rate 16, height 5\' 3"  (1.6 m), SpO2 97 %. Physical Exam  Constitutional: She is oriented to person, place, and time. She appears well-developed and well-nourished.  HENT:  Head: Atraumatic.  Eyes: EOM are normal.  Cardiovascular: Intact distal pulses.  Respiratory: Effort normal.  Musculoskeletal:  R shoulder pain with limited ROM.  Neurological: She is alert and oriented to person, place, and time.  Skin: Skin is warm and dry.  Psychiatric: She has a normal mood and affect.     Assessment/Plan S/p R TSA with subscapularis failure.  Indicated for revision to reverse shoulder to improve function.  Risks / benefits of surgery discussed Consent on chart  NPO for OR Preop antibiotics   Berline Lopes, MD 12/30/2017, 7:26 AM

## 2017-12-31 ENCOUNTER — Encounter (HOSPITAL_COMMUNITY): Payer: Self-pay | Admitting: Orthopedic Surgery

## 2017-12-31 LAB — BASIC METABOLIC PANEL
Anion gap: 9 (ref 5–15)
BUN: 15 mg/dL (ref 8–23)
CHLORIDE: 97 mmol/L — AB (ref 98–111)
CO2: 28 mmol/L (ref 22–32)
Calcium: 8.3 mg/dL — ABNORMAL LOW (ref 8.9–10.3)
Creatinine, Ser: 0.99 mg/dL (ref 0.44–1.00)
GFR calc Af Amer: >60 mL/min (ref >60)
GFR calc non Af Amer: 58 mL/min — ABNORMAL LOW (ref >60)
Glucose, Bld: 300 mg/dL — ABNORMAL HIGH (ref 70–99)
POTASSIUM: 3 mmol/L — AB (ref 3.5–5.1)
SODIUM: 134 mmol/L — AB (ref 135–145)

## 2017-12-31 LAB — CBC
HEMATOCRIT: 28.8 % — AB (ref 36.0–46.0)
HEMOGLOBIN: 9.2 g/dL — AB (ref 12.0–15.0)
MCH: 29.4 pg (ref 26.0–34.0)
MCHC: 31.9 g/dL (ref 30.0–36.0)
MCV: 92 fL (ref 80.0–100.0)
Platelets: 293 10*3/uL (ref 150–400)
RBC: 3.13 MIL/uL — AB (ref 3.87–5.11)
RDW: 13.4 % (ref 11.5–15.5)
WBC: 12 10*3/uL — ABNORMAL HIGH (ref 4.0–10.5)
nRBC: 0 % (ref 0.0–0.2)

## 2017-12-31 LAB — GLUCOSE, RANDOM: Glucose, Bld: 123 mg/dL — ABNORMAL HIGH (ref 70–99)

## 2017-12-31 MED ORDER — OXYCODONE-ACETAMINOPHEN 5-325 MG PO TABS: 1.0000 | ORAL_TABLET | ORAL | 0 refills | Status: AC | PRN

## 2017-12-31 MED ORDER — POTASSIUM CHLORIDE 20 MEQ/15ML (10%) PO SOLN
40.0000 meq | Freq: Two times a day (BID) | ORAL | Status: DC
  Administered 2017-12-31: 40 meq via ORAL
  Filled 2017-12-31: qty 30

## 2017-12-31 NOTE — Progress Notes (Signed)
Discharge instructions completed with pt.  Pt verbalized understanding of the information.  Pt denies chest pain, shortness of breath, dizziness, lightheadedness, and n/v.  Pt's IV discontinued.  Pt discharged home.  

## 2017-12-31 NOTE — Evaluation (Signed)
Occupational Therapy Evaluation and Discharge Patient Details Name: Sarah Villarreal MRN: [id number] DOB: Jan 28, 1951 Today's Date: 12/31/2017    History of Present Illness Pt is a 67 y/o female  With R TSA with subscapularis failure.  s/p revision to reverse shoulder to improve function.   Clinical Impression   PTA Pt at home with husband, previous TSA in June. Pt is currently max A for UB dressing,educated on shoulder precautions (handout provided and reviewed in full): Educated patient on don doff brace with return demonstration,washing under arms with cloth, never to wash directly on incision site, avoid shoulder movement. positioning with pillows in chair, sleeping positioning (remain in sling), pt educated on dressing care during bathing/ dressing.  Home exercise program as stated below (indicated by MD). OT education complete. Pt verbalized understanding. OT to defer further therapy to MD at follow up.      Follow Up Recommendations  Follow surgeon's recommendation for DC plan and follow-up therapies    Equipment Recommendations  None recommended by OT    Recommendations for Other Services       Precautions / Restrictions Precautions Precautions: Shoulder Type of Shoulder Precautions: conservative Shoulder Interventions: Shoulder sling/immobilizer;At all times;Off for dressing/bathing/exercises Precaution Booklet Issued: Yes (comment) Precaution Comments: reviewed shoulder dc handout in full Restrictions Weight Bearing Restrictions: Yes RUE Weight Bearing: Non weight bearing      Mobility Bed Mobility Overal bed mobility: Modified Independent             General bed mobility comments: increased time, no attempt to push or pull with RUE  Transfers Overall transfer level: Modified independent Equipment used: None             General transfer comment: no assist needed, no dizziness    Balance Overall balance assessment: No apparent balance deficits (not  formally assessed)                                         ADL either performed or assessed with clinical judgement   ADL                                         General ADL Comments: see shoulder section below     Vision Patient Visual Report: No change from baseline       Perception     Praxis      Pertinent Vitals/Pain Pain Assessment: 0-10 Pain Score: 2 (block still largely in place) Pain Location: R shoulder Pain Descriptors / Indicators: Dull Pain Intervention(s): Monitored during session;Repositioned;Ice applied     Hand Dominance Right   Extremity/Trunk Assessment Upper Extremity Assessment Upper Extremity Assessment: RUE deficits/detail RUE Deficits / Details: deficits as anticipated post-op RUE Sensation: decreased light touch(block still largely in place) RUE Coordination: decreased gross motor   Lower Extremity Assessment Lower Extremity Assessment: Overall WFL for tasks assessed       Communication Communication Communication: No difficulties   Cognition Arousal/Alertness: Awake/alert Behavior During Therapy: WFL for tasks assessed/performed Overall Cognitive Status: Within Functional Limits for tasks assessed                                     General Comments  Exercises Exercises: Shoulder Shoulder Exercises Elbow Flexion: AROM;Right;Seated Elbow Extension: AROM;Right;Seated;Standing Wrist Flexion: AROM;Right Wrist Extension: AROM;Right Digit Composite Flexion: AROM;Right Composite Extension: AROM;Right Neck Flexion: AROM Neck Extension: AROM Neck Lateral Flexion - Right: AROM Neck Lateral Flexion - Left: AROM   Shoulder Instructions Shoulder Instructions Donning/doffing shirt without moving shoulder: Maximal assistance;Caregiver independent with task;Patient able to independently direct caregiver Method for sponge bathing under operated UE: Supervision/safety;Patient able to  independently direct caregiver Donning/doffing sling/immobilizer: Maximal assistance;Patient able to independently direct caregiver;Caregiver independent with task Correct positioning of sling/immobilizer: Moderate assistance;Caregiver independent with task;Patient able to independently direct caregiver ROM for elbow, wrist and digits of operated UE: Supervision/safety Sling wearing schedule (on at all times/off for ADL's): Independent Proper positioning of operated UE when showering: Supervision/safety Positioning of UE while sleeping: Moderate assistance    Home Living Family/patient expects to be discharged to:: Private residence Living Arrangements: Spouse/significant other Available Help at Discharge: Family Type of Home: House Home Access: Stairs to enter Entergy CorporationEntrance Stairs-Number of Steps: 11 + 4  Entrance Stairs-Rails: Left Home Layout: Multi-level Alternate Level Stairs-Number of Steps: 2   Bathroom Shower/Tub: Tub/shower unit;Walk-in shower   Bathroom Toilet: Standard Bathroom Accessibility: Yes How Accessible: Accessible via walker Home Equipment: Walker - 2 wheels;Bedside commode;Cane - single point;Shower seat - built in          Prior Functioning/Environment Level of Independence: Independent                 OT Problem List: Decreased range of motion;Decreased activity tolerance;Pain;Impaired UE functional use      OT Treatment/Interventions:      OT Goals(Current goals can be found in the care plan section) Acute Rehab OT Goals Patient Stated Goal: to get back to independent OT Goal Formulation: With patient Time For Goal Achievement: 01/14/18 Potential to Achieve Goals: Good  OT Frequency:     Barriers to D/C:            Co-evaluation              AM-PAC PT "6 Clicks" Daily Activity     Outcome Measure Help from another person eating meals?: A Little Help from another person taking care of personal grooming?: A Little Help from another  person toileting, which includes using toliet, bedpan, or urinal?: None Help from another person bathing (including washing, rinsing, drying)?: A Little Help from another person to put on and taking off regular upper body clothing?: A Lot Help from another person to put on and taking off regular lower body clothing?: A Little 6 Click Score: 18   End of Session Equipment Utilized During Treatment: Other (comment)(sling) Nurse Communication: Mobility status;Weight bearing status  Activity Tolerance: Patient tolerated treatment well Patient left: in bed;with call bell/phone within reach  OT Visit Diagnosis: Pain Pain - Right/Left: Right Pain - part of body: Shoulder                Time: [id number] OT Time Calculation (min): 27 min Charges:  OT General Charges $OT Visit: 1 Visit OT Evaluation $OT Eval Moderate Complexity: 1 Mod OT Treatments $Self Care/Home Management : 8-22 mins  Sherryl MangesLaura Dervin Vore OTR/L Acute Rehabilitation Services Pager: [phone] Office: [phone]  Evern BioLaura J Katarzyna Wolven 12/31/2017, 9:45 AM

## 2017-12-31 NOTE — Plan of Care (Signed)

## 2017-12-31 NOTE — Progress Notes (Signed)
   PATIENT ID: Sarah Villarreal   1 Day Post-Op Procedure(s) (LRB): RIGHT TOTAL SHOULDER REVISION TO REVERSE SHOULDER TO REVERSE ARHTROPLASTY (Right)  Subjective: Reports doing great. Minimal pain. Denies lightheadedness, dizziness. Reports history of low potassium and on supplements, did not take for 24 hrs prior to surgery but will restart.   Objective:  Vitals:   12/30/17 1119 12/30/17 2110  BP:  (!) 100/59  Pulse:  74  Resp: 18 14  Temp:  98.4 F (36.9 C)  SpO2:  96%     R UE dressing c/d/i Wiggles fingers, distally NVI  Labs:  Recent Labs    12/31/17 0300  HGB 9.2*   Recent Labs    12/31/17 0300  WBC 12.0*  RBC 3.13*  HCT 28.8*  PLT 293   Recent Labs    12/31/17 0300  NA 134*  K 3.0*  CL 97*  CO2 28  BUN 15  CREATININE 0.99  GLUCOSE 300*  CALCIUM 8.3*    Assessment and Plan: 1 day s/p right revision to reverse TSA OT- hand wrist elbow Low K- will supplement and has rx at home for supplementation  High blood surgery with no hx of DM- will do a stat recheck D/c home today if glucose within acceptable range and cleared by OT Fu with Dr. Ave Filterhandler in 2 weeks   VTE proph: asa, scds

## 2017-12-31 NOTE — Discharge Summary (Signed)
Patient ID: Sarah Villarreal MRN: [id number] DOB/AGE: [birth year] 67 y.o.  Admit date: 12/30/2017 Discharge date: 12/31/2017  Admission Diagnoses:  Active Problems:   S/P reverse total shoulder arthroplasty, right   Discharge Diagnoses:  Same  Past Medical History:  Diagnosis Date  . Anemia    history of  . Anxiety   . Arthritis   . Depression   . Elevated cholesterol   . Endometrial polyp   . Family history of adverse reaction to anesthesia    sister has nausea/vomiting  . GERD (gastroesophageal reflux disease)    occasionally  . Headache   . 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 Osteopenia 02/2017   T score -2.1 distal third left forearm  . Osteoporosis   . Pneumonia   . PONV (postoperative nausea and vomiting)   . TIA (transient ischemic attack) fall 2018   states it was possible    Surgeries: Procedure(s): RIGHT TOTAL SHOULDER REVISION TO REVERSE SHOULDER TO REVERSE ARHTROPLASTY on 12/30/2017   Consultants:   Discharged Condition: Improved  Hospital Course: Sarah Villarreal is an 67 y.o. female who was admitted 12/30/2017 for operative treatment of revision to reverse total shoulder arthroplasty after subscapularis failure. Patient has severe unremitting pain that affects sleep, daily activities, and work/hobbies. After pre-op clearance the patient was taken to the operating room on 12/30/2017 and underwent  Procedure(s): RIGHT TOTAL SHOULDER REVISION TO REVERSE SHOULDER TO REVERSE ARHTROPLASTY.    Patient was given perioperative antibiotics:  Anti-infectives (From admission, onward)   Start     Dose/Rate Route Frequency Ordered Stop   12/30/17 1400  ceFAZolin (ANCEF) IVPB 1 g/50 mL premix     1 g 100 mL/hr over 30 Minutes Intravenous Every 6 hours 12/30/17 1117 12/31/17 0306   12/30/17 0600  ceFAZolin (ANCEF) IVPB 2g/100 mL premix     2 g 200 mL/hr over 30 Minutes Intravenous On call to O.R. 12/30/17 0545 12/30/17  0818       Patient was given sequential compression devices, early ambulation, and asa to prevent DVT.  Patient benefited maximally from hospital stay and there were no complications.    Recent vital signs:  Patient Vitals for the past 24 hrs:  BP Temp Temp src Pulse Resp SpO2  12/30/17 2110 (!) 100/59 98.4 F (36.9 C) Oral 74 14 96 %  12/30/17 1119 - - - - 18 -  12/30/17 1118 105/69 97.9 F (36.6 C) Oral 72 - 92 %  12/30/17 1106 - - - 72 17 94 %  12/30/17 1105 - (!) 97 F (36.1 C) - - - -  12/30/17 1100 - - - 73 16 92 %  12/30/17 1057 104/67 - - - - -  12/30/17 1045 - - - 71 16 94 %  12/30/17 1041 116/64 - - - - -  12/30/17 1030 - - - 73 18 94 %  12/30/17 1026 124/69 - - - - -  12/30/17 1015 - - - 76 (!) 21 96 %  12/30/17 1012 129/74 - - - - -  12/30/17 1000 135/77 97.8 F (36.6 C) - 80 19 100 %     Recent laboratory studies:  Recent Labs    12/31/17 0300  WBC 12.0*  HGB 9.2*  HCT 28.8*  PLT 293  NA 134*  K 3.0*  CL 97*  CO2 28  BUN 15  CREATININE 0.99  GLUCOSE 300*  CALCIUM 8.3*     Discharge Medications:  Allergies as of 12/31/2017      Reactions   Onion Nausea And Vomiting   Adhesive [tape] Dermatitis   Certain band aids cause skin irritation    Meperidine Hcl Nausea And Vomiting      Medication List    STOP taking these medications   HYDROcodone-acetaminophen 5-325 MG tablet Commonly known as:  NORCO/VICODIN   meloxicam 15 MG tablet Commonly known as:  MOBIC     TAKE these medications   buPROPion 150 MG 24 hr tablet Commonly known as:  WELLBUTRIN XL Take 150 mg by mouth daily.   CoQ10 100 MG Caps Take 100 mg by mouth at bedtime.   docusate sodium 100 MG capsule Commonly known as:  COLACE Take 1 capsule (100 mg total) by mouth 3 (three) times daily as needed.   Echinacea 400 MG Caps Take 400 mg by mouth daily.   ezetimibe 10 MG tablet Commonly known as:  ZETIA Take 10 mg by mouth daily.   FLOVENT HFA 220 MCG/ACT  inhaler Generic drug:  fluticasone Inhale 1 puff into the lungs 2 (two) times daily.   fluticasone 50 MCG/ACT nasal spray Commonly known as:  FLONASE Place 2 sprays into both nostrils 2 (two) times daily.   gabapentin 300 MG capsule Commonly known as:  NEURONTIN Take 300 mg by mouth 3 (three) times daily.   ibandronate 150 MG tablet Commonly known as:  BONIVA Take 150 mg by mouth every 30 (thirty) days. Take in the morning with a full glass of water, on an empty stomach, and do not take anything else by mouth or lie down for the next 30 min.   IRON-C PO Take 1 tablet by mouth daily.   levothyroxine 75 MCG tablet Commonly known as:  SYNTHROID, LEVOTHROID Take 75 mcg by mouth daily before breakfast.   methocarbamol 500 MG tablet Commonly known as:  ROBAXIN Take 1 tablet (500 mg total) by mouth 3 (three) times daily.   montelukast 10 MG tablet Commonly known as:  SINGULAIR Take 10 mg by mouth at bedtime.   omeprazole 40 MG capsule Commonly known as:  PRILOSEC Take 40 mg by mouth daily as needed (heart burn).   oxyCODONE-acetaminophen 5-325 MG tablet Commonly known as:  PERCOCET/ROXICET Take 1-2 tablets by mouth every 4 (four) hours as needed for severe pain. What changed:  how much to take   PARoxetine 20 MG tablet Commonly known as:  PAXIL Take 20 mg by mouth every morning.   potassium chloride 10 MEQ tablet Commonly known as:  K-DUR,KLOR-CON Take 20 mEq by mouth 2 (two) times daily.   rizatriptan 10 MG tablet Commonly known as:  MAXALT Take 10 mg by mouth every 2 (two) hours as needed for migraine.   temazepam 30 MG capsule Commonly known as:  RESTORIL Take 30 mg by mouth at bedtime.   triamterene-hydrochlorothiazide 37.5-25 MG tablet Commonly known as:  MAXZIDE-25 TAKE ONE TABLET BY MOUTH DAILY What changed:  when to take this   vitamin C 500 MG tablet Commonly known as:  ASCORBIC ACID Take 500 mg by mouth daily.   Vitamin D 50 MCG (2000 UT)  tablet Take 2,000 Units by mouth 2 (two) times daily.   zinc gluconate 50 MG tablet Take 50 mg by mouth daily.       Diagnostic Studies: Dg Shoulder Right Port  Result Date: 12/30/2017 CLINICAL DATA:  Right shoulder replacement EXAM: PORTABLE RIGHT SHOULDER COMPARISON:  07/22/2017 FINDINGS: Interval revision of the right shoulder replacement. Normal  AP alignment. No visible bony complicating feature. IMPRESSION: Right shoulder placement revision.  No visible complicating feature. Electronically Signed   By: Charlett Nose M.D.   On: 12/30/2017 11:09    Disposition: Discharge disposition: 01-Home or Self Care       Discharge Instructions    Call MD / Call 911   Complete by:  As directed    If you experience chest pain or shortness of breath, CALL 911 and be transported to the hospital emergency room.  If you develope a fever above 101 F, pus (white drainage) or increased drainage or redness at the wound, or calf pain, call your surgeon's office.   Constipation Prevention   Complete by:  As directed    Drink plenty of fluids.  Prune juice may be helpful.  You may use a stool softener, such as Colace (over the counter) 100 mg twice a day.  Use MiraLax (over the counter) for constipation as needed.   Diet - low sodium heart healthy   Complete by:  As directed    Increase activity slowly as tolerated   Complete by:  As directed       Follow-up Information    Jones Broom, MD. Schedule an appointment as soon as possible for a visit in 2 weeks.   Specialty:  Orthopedic Surgery Contact information: [address] [place] [address] [phone]            Signed: Jiles Harold 12/31/2017, 8:49 AM

## 2018-09-23 ENCOUNTER — Other Ambulatory Visit: Payer: Self-pay | Admitting: Neurosurgery

## 2018-09-23 DIAGNOSIS — M4120 Other idiopathic scoliosis, site unspecified: Secondary | ICD-10-CM

## 2018-10-20 ENCOUNTER — Ambulatory Visit
Admission: RE | Admit: 2018-10-20 | Discharge: 2018-10-20 | Disposition: A | Discharge status: Home / Self Care | Payer: Medicare Other | Source: Ambulatory Visit | Attending: Neurosurgery | Admitting: Neurosurgery

## 2018-10-20 ENCOUNTER — Other Ambulatory Visit: Payer: Self-pay

## 2018-10-20 DIAGNOSIS — M4120 Other idiopathic scoliosis, site unspecified: Secondary | ICD-10-CM

## 2018-11-23 ENCOUNTER — Encounter: Payer: Self-pay | Admitting: Gynecology

## 2018-11-28 ENCOUNTER — Other Ambulatory Visit (HOSPITAL_BASED_OUTPATIENT_CLINIC_OR_DEPARTMENT_OTHER): Payer: Self-pay | Admitting: Neurosurgery

## 2018-11-28 DIAGNOSIS — M8580 Other specified disorders of bone density and structure, unspecified site: Secondary | ICD-10-CM

## 2018-11-28 DIAGNOSIS — Z1231 Encounter for screening mammogram for malignant neoplasm of breast: Secondary | ICD-10-CM

## 2018-12-21 ENCOUNTER — Other Ambulatory Visit (HOSPITAL_BASED_OUTPATIENT_CLINIC_OR_DEPARTMENT_OTHER): Payer: Self-pay | Admitting: Neurosurgery

## 2018-12-21 ENCOUNTER — Other Ambulatory Visit: Payer: Self-pay

## 2018-12-21 ENCOUNTER — Ambulatory Visit (INDEPENDENT_AMBULATORY_CARE_PROVIDER_SITE_OTHER): Payer: Medicare Other

## 2018-12-21 DIAGNOSIS — Z01818 Encounter for other preprocedural examination: Secondary | ICD-10-CM

## 2018-12-21 DIAGNOSIS — E2839 Other primary ovarian failure: Secondary | ICD-10-CM

## 2018-12-21 DIAGNOSIS — M8580 Other specified disorders of bone density and structure, unspecified site: Secondary | ICD-10-CM

## 2018-12-21 DIAGNOSIS — Z8739 Personal history of other diseases of the musculoskeletal system and connective tissue: Secondary | ICD-10-CM | POA: Diagnosis not present

## 2018-12-21 DIAGNOSIS — M810 Age-related osteoporosis without current pathological fracture: Secondary | ICD-10-CM

## 2018-12-21 DIAGNOSIS — Z13828 Encounter for screening for other musculoskeletal disorder: Secondary | ICD-10-CM

## 2019-12-17 IMAGING — MR MR LUMBAR SPINE W/O CM
4 of 5 series · 25 of 48 positions shown · non-contrast
Comparison: Radiographs dated 09/12/2018 and 04/29/2018 and MRI
dated 12/12/2016

CLINICAL DATA: Chronic progressive low back pain and right buttock
and leg pain with numbness in right leg and foot.

EXAM:
MRI LUMBAR SPINE WITHOUT CONTRAST
TECHNIQUE: Multiplanar, multisequence MR imaging of the lumbar spine was
performed. No intravenous contrast was administered.

[Series 4: T1 · sagittal · 4.0mm · 0.55mm/px · 5 of 15 slices shown (1 of 2)]
[im 1/15]
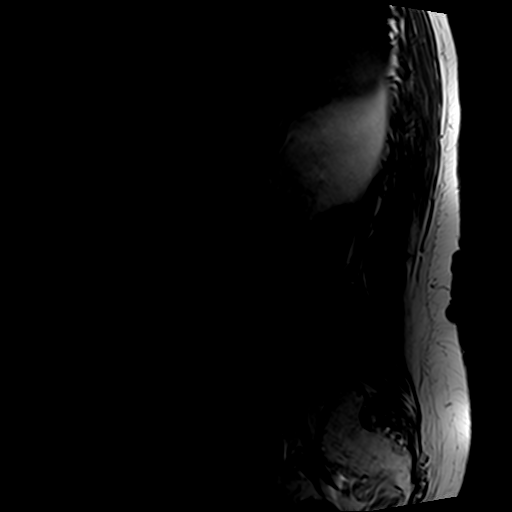
[im 4/15]
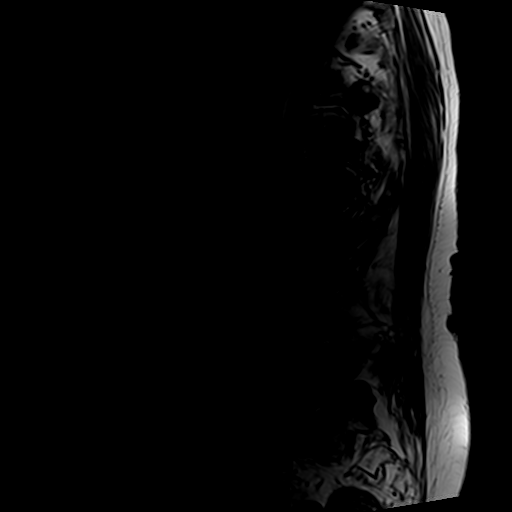
[im 8/15]
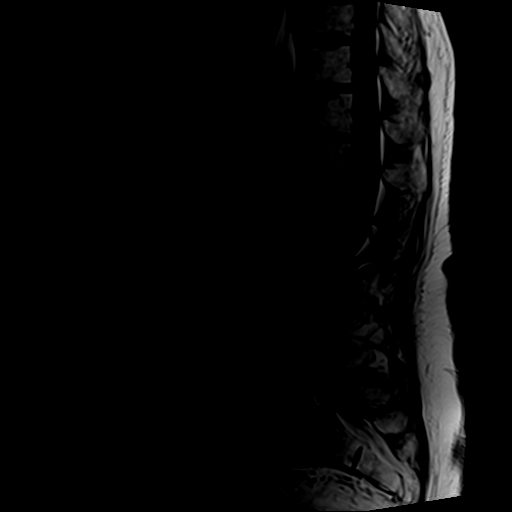
[im 11/15]
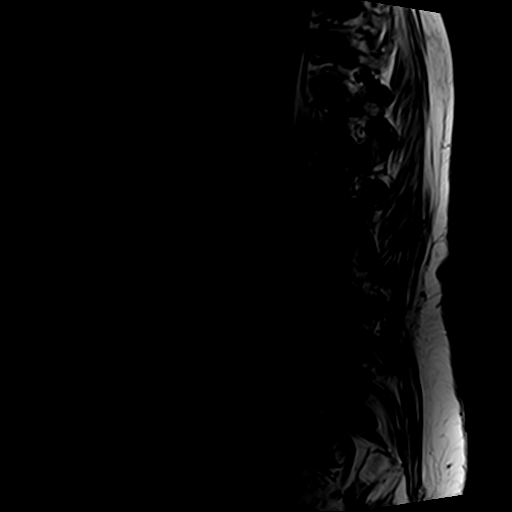
[im 15/15]
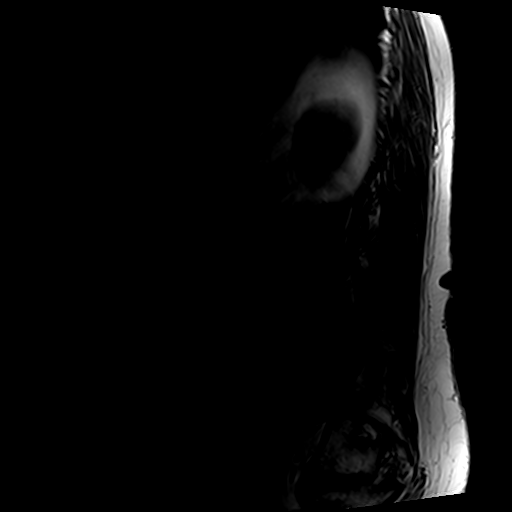

[Series 5: T2 post-contrast · sagittal · 4.0mm · 0.55mm/px · 5 of 15 slices shown]
[im 1/15]
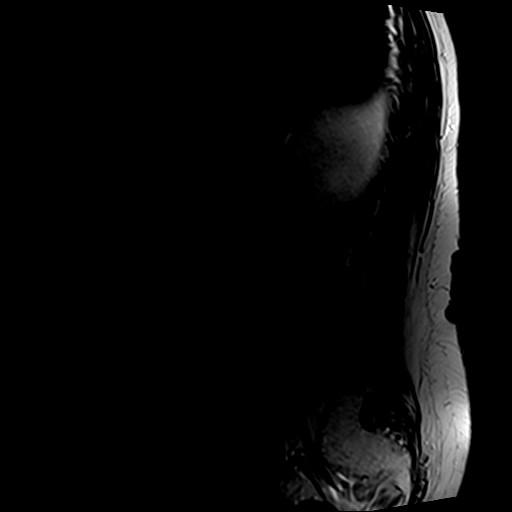
[im 4/15]
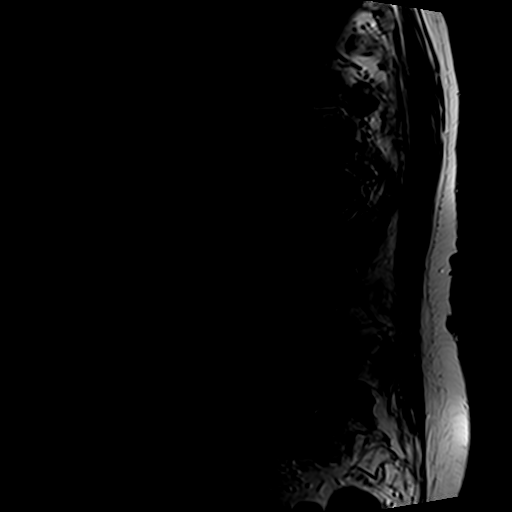
[im 8/15]
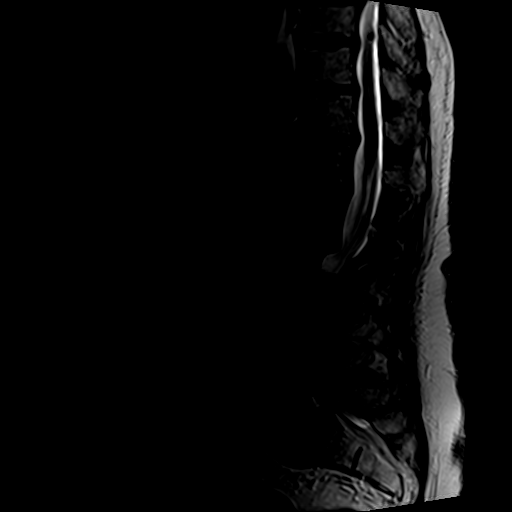
[im 11/15]
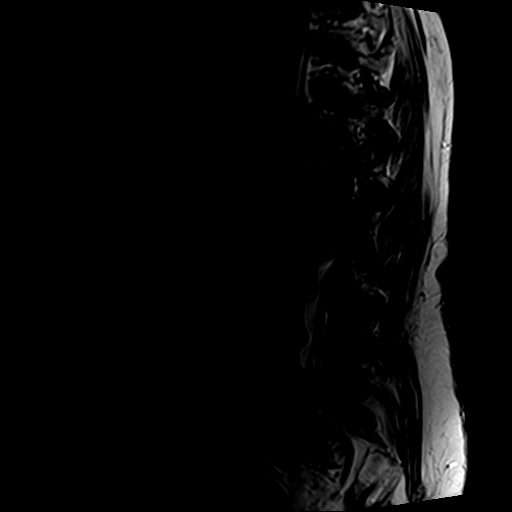
[im 15/15]
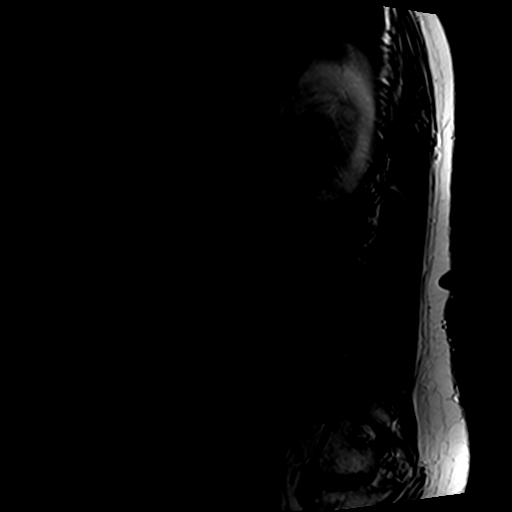

[Series 6: T2 · axial · 4.0mm · 0.70mm/px · z∈[-85,+135]mm · 11 of 50 slices shown]
[im 4/50]
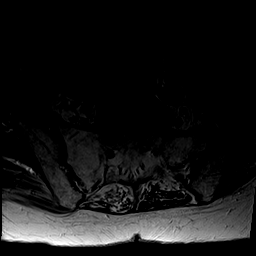
[im 7/50]
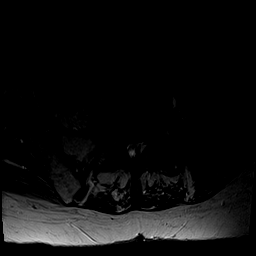
[im 10/50]
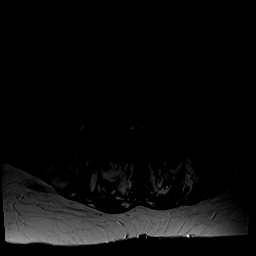
[im 16/50]
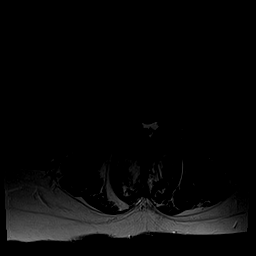
[im 22/50]
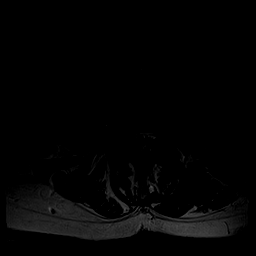
[im 25/50]
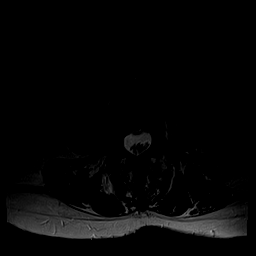
[im 28/50]
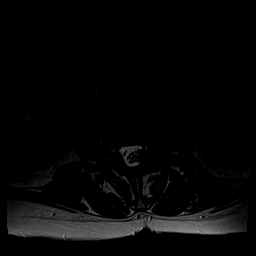
[im 34/50]
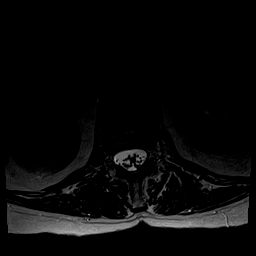
[im 40/50]
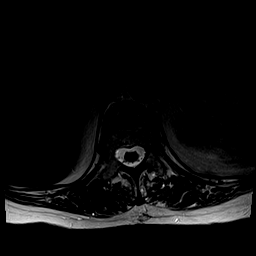
[im 43/50]
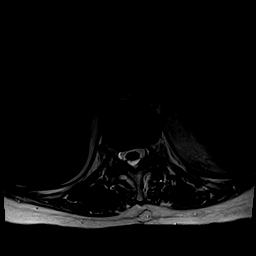
[im 46/50]
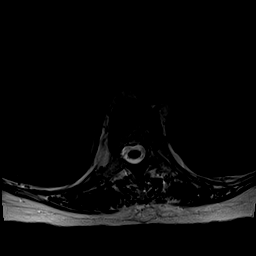

[Series 7: T1 · axial · 4.0mm · 0.35mm/px · z∈[-85,+121]mm · 4 of 50 slices shown (2 of 2)]
[im 4/50]
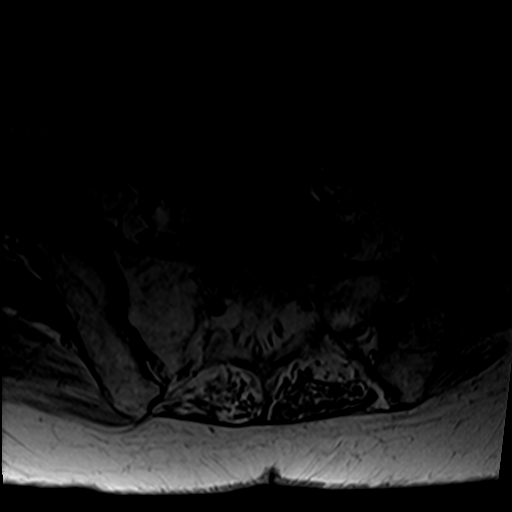
[im 7/50]
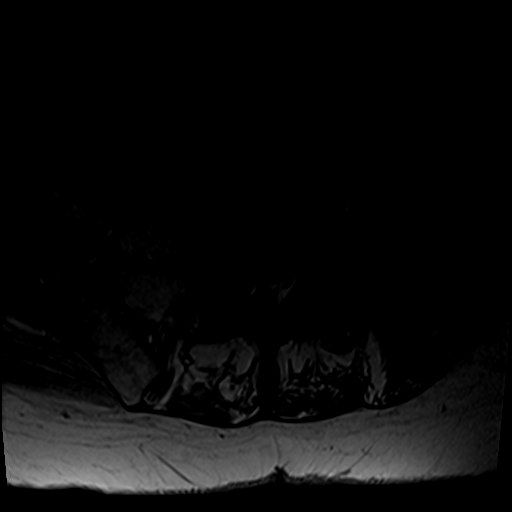
[im 25/50]
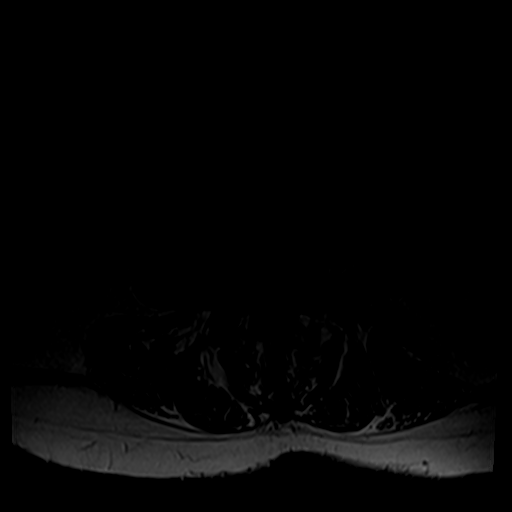
[im 43/50]
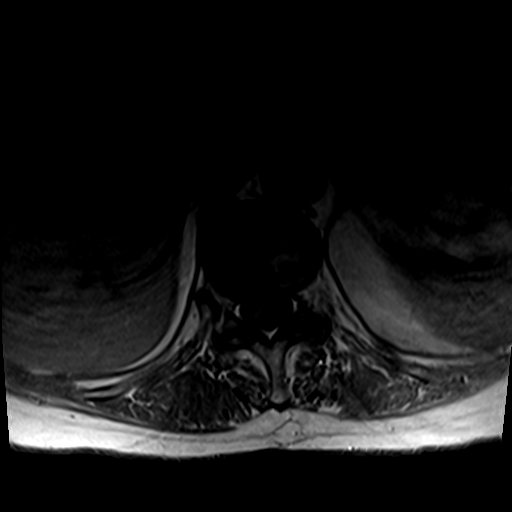

[25 of 48 positions shown; findings below may reference images not displayed]

FINDINGS: Segmentation:  Standard.

Alignment: 3 mm retrolisthesis of L1 on L2. 4 mm retrolisthesis of
L2 on L3. 3 mm anterolisthesis of L5 on S1. 3 mm anterolisthesis of
L4 on L5. Lumbar scoliosis with convexity to the left centered at
L4-5.

Vertebrae:  No fracture, evidence of discitis, or bone lesion.

Conus medullaris and cauda equina: Conus extends to the T12-L1
level. Conus appears normal.

Paraspinal and other soft tissues: Negative.

Disc levels:

T11-12: Small broad-based disc bulge without neural impingement,
unchanged.

T12-L1: No disc bulging or protrusion.  No neural impingement.

L1-2: Disc degeneration with retrolisthesis with a broad-based bulge
of the uncovered disc symmetrical compression of the thecal sac
without focal neural impingement, unchanged. Severe left foraminal
stenosis, unchanged.

L2-3: Broad-based disc bulge and slight retrolisthesis. Small right
disc extrusion extending inferiorly behind the body of L3
compressing the thecal sac and slightly deviating the right L4 nerve
rootlets posteriorly in the thecal sac, unchanged. Mild spinal
stenosis, unchanged.

L3-4: Chronic disc space narrowing. Small broad-based disc bulge
with a tiny central subligamentous disc protrusion, unchanged. Mild
narrowing of the spinal canal. Stable mild degenerative changes of
the facet joints. Moderate bilateral foraminal stenosis, unchanged.

L4-5: Severe bilateral facet arthritis 3 mm spondylolisthesis.
Broad-based disc bulge with a small chronic central disc protrusion.
Severe spinal stenosis, progressed since the prior study. Chronic
right foraminal stenosis.

L5-S1: Chronic disc degeneration and spondylolisthesis. Broad-based
disc bulge with a central disc protrusion, unchanged. Severe
bilateral facet arthritis, unchanged. The hypertrophied right facet
joint appears to compress the right S1 nerve root sleeve on image 44
of series 6.
IMPRESSION: 1. Severe spinal stenosis at L4-5, progressed since the prior study.
Both lateral recesses are compressed at L4-5. This could affect both
L5 nerves.
2. Hypertrophied right facet joint at L5-S1 appears to compress the
right S1 nerve root sleeve.
3. Extensive degenerative disc disease throughout the remainder of
the lumbar spine, stable since the prior study as described above.

## 2022-10-16 ENCOUNTER — Emergency Department (HOSPITAL_BASED_OUTPATIENT_CLINIC_OR_DEPARTMENT_OTHER): Payer: Medicare HMO

## 2022-10-16 ENCOUNTER — Other Ambulatory Visit: Payer: Self-pay

## 2022-10-16 ENCOUNTER — Emergency Department (HOSPITAL_BASED_OUTPATIENT_CLINIC_OR_DEPARTMENT_OTHER)
Admission: EM | Admit: 2022-10-16 | Discharge: 2022-10-16 | Disposition: A | Discharge status: Home / Self Care | Payer: Medicare HMO | Attending: Emergency Medicine | Admitting: Emergency Medicine

## 2022-10-16 ENCOUNTER — Encounter (HOSPITAL_BASED_OUTPATIENT_CLINIC_OR_DEPARTMENT_OTHER): Payer: Self-pay

## 2022-10-16 DIAGNOSIS — S0990XA Unspecified injury of head, initial encounter: Secondary | ICD-10-CM | POA: Diagnosis present

## 2022-10-16 DIAGNOSIS — Y92009 Unspecified place in unspecified non-institutional (private) residence as the place of occurrence of the external cause: Secondary | ICD-10-CM | POA: Diagnosis not present

## 2022-10-16 DIAGNOSIS — S0083XA Contusion of other part of head, initial encounter: Secondary | ICD-10-CM

## 2022-10-16 DIAGNOSIS — W19XXXA Unspecified fall, initial encounter: Secondary | ICD-10-CM

## 2022-10-16 DIAGNOSIS — W01198A Fall on same level from slipping, tripping and stumbling with subsequent striking against other object, initial encounter: Secondary | ICD-10-CM | POA: Diagnosis not present

## 2022-10-16 NOTE — ED Provider Notes (Signed)
[place] [facility]GENC[facility] DEPARTMENT AT [facility] Provider Note   CSN: [id number] Arrival date & time: 10/16/22  1836     History  Chief Complaint  Patient presents with   [facility] Injury    Sarah Villarreal is a 72 y.o. female.  Patient presents to the emergency department today for evaluation of head injury.  Patient fell at home this afternoon.  She tripped over an object that was on the floor in the living room.  She struck the left face.  She had some swelling and bruising of the cheek.  No loss of consciousness.  She has a headache but no confusion or vomiting.  Dentition intact.  Mild jaw tenderness.  No neck pain.  Reports history of spine surgeries.  No anticoagulation.  She did take Excedrin earlier today.      Home Medications Prior to Admission medications   Medication Sig Start Date End Date Taking? Authorizing Provider  buPROPion (WELLBUTRIN XL) 150 MG 24 hr tablet Take 150 mg by mouth daily. 11/03/17 11/03/18  [provider]  Cholecalciferol (VITAMIN D) 2000 units tablet Take 2,000 Units by mouth 2 (two) times daily.     [provider]  Coenzyme Q10 (COQ10) 100 MG CAPS Take 100 mg by mouth at bedtime.     [provider]  docusate sodium (COLACE) 100 MG capsule Take 1 capsule (100 mg total) by mouth 3 (three) times daily as needed. Patient not taking: Reported on 12/20/2017 07/22/17   Jiles Harold, PA-C  Echinacea 400 MG CAPS Take 400 mg by mouth daily.     [provider]  ezetimibe (ZETIA) 10 MG tablet Take 10 mg by mouth daily.    [provider]  Ferrous Gluconate-C-Folic Acid (IRON-C PO) Take 1 tablet by mouth daily.    [provider]  FLOVENT HFA 220 MCG/ACT inhaler Inhale 1 puff into the lungs 2 (two) times daily. 12/15/17   [provider]  fluticasone (FLONASE) 50 MCG/ACT nasal spray Place 2 sprays into both nostrils 2 (two) times daily.    [provider]  gabapentin  (NEURONTIN) 300 MG capsule Take 300 mg by mouth 3 (three) times daily.     [provider]  ibandronate (BONIVA) 150 MG tablet Take 150 mg by mouth every 30 (thirty) days. Take in the morning with a full glass of water, on an empty stomach, and do not take anything else by mouth or lie down for the next 30 min.    [provider]  levothyroxine (SYNTHROID, LEVOTHROID) 75 MCG tablet Take 75 mcg by mouth daily before breakfast.     [provider]  methocarbamol (ROBAXIN) 500 MG tablet Take 1 tablet (500 mg total) by mouth 3 (three) times daily. Patient not taking: Reported on 12/20/2017 07/22/17   Jiles Harold, PA-C  montelukast (SINGULAIR) 10 MG tablet Take 10 mg by mouth at bedtime. 12/02/17   [provider]  omeprazole (PRILOSEC) 40 MG capsule Take 40 mg by mouth daily as needed (heart burn).     [provider]  PARoxetine (PAXIL) 20 MG tablet Take 20 mg by mouth every morning.      [provider]  potassium chloride (K-DUR,KLOR-CON) 10 MEQ tablet Take 20 mEq by mouth 2 (two) times daily.    [provider]  rizatriptan (MAXALT) 10 MG tablet Take 10 mg by mouth every 2 (two) hours as needed for migraine.  06/14/17   [provider]  temazepam (RESTORIL) 30 MG capsule Take 30 mg by mouth at bedtime.  09/30/16   [provider]  triamterene-hydrochlorothiazide (MAXZIDE-25) 37.5-25 MG per tablet TAKE ONE TABLET BY MOUTH DAILY Patient taking differently: Take 1 tablet by mouth 2 (two) times daily.  03/09/11   Trellis Paganini, MD  vitamin C (ASCORBIC ACID) 500 MG tablet Take 500 mg by mouth daily.    [provider]  zinc gluconate 50 MG tablet Take 50 mg by mouth daily.    [provider]      Allergies    Onion, Celecoxib, Cyclobenzaprine, Oxycodone, Adhesive [tape], and Meperidine hcl    Review of Systems   Review of Systems  Physical Exam Updated Vital Signs BP (!) 157/89 (BP Location:  Right Arm)   Pulse 68   Temp 97.7 F (36.5 C) (Oral)   Resp 18   Ht 5\' 3"  (1.6 m)   Wt 62 kg   SpO2 100%   BMI 24.21 kg/m  Physical Exam Vitals and nursing note reviewed.  Constitutional:      Appearance: She is well-developed.  HENT:     Head: Normocephalic. No raccoon eyes or Battle's sign.     Comments: Mild ecchymosis and swelling of the left forehead and left supraorbital region.  Mild tenderness to the inferior orbit with slight swelling here as well.  No deformities.    Right Ear: Tympanic membrane, ear canal and external ear normal. No hemotympanum.     Left Ear: Tympanic membrane, ear canal and external ear normal. No hemotympanum.     Nose: Nose normal.     Mouth/Throat:     Pharynx: Uvula midline.  Eyes:     General: Lids are normal.     Extraocular Movements:     Right eye: No nystagmus.     Left eye: No nystagmus.     Conjunctiva/sclera: Conjunctivae normal.     Pupils: Pupils are equal, round, and reactive to light.     Comments: No visible hyphema noted  Cardiovascular:     Rate and Rhythm: Normal rate and regular rhythm.  Pulmonary:     Effort: Pulmonary effort is normal.     Breath sounds: Normal breath sounds.  Abdominal:     Palpations: Abdomen is soft.     Tenderness: There is no abdominal tenderness.  Musculoskeletal:     Cervical back: Normal range of motion and neck supple. No tenderness or bony tenderness.     Thoracic back: No tenderness or bony tenderness.     Lumbar back: No tenderness or bony tenderness.  Skin:    General: Skin is warm and dry.  Neurological:     Mental Status: She is alert and oriented to person, place, and time.     GCS: GCS eye subscore is 4. GCS verbal subscore is 5. GCS motor subscore is 6.     Cranial Nerves: No cranial nerve deficit.     Sensory: No sensory deficit.     Coordination: Coordination normal.     ED Results / Procedures / Treatments   Labs (all labs ordered are listed, but only abnormal results are  displayed) Labs Reviewed - No data to display  EKG None  Radiology No results found.  Procedures Procedures    Medications Ordered in ED Medications - No data to display  ED Course/ Medical Decision Making/ A&P    Patient seen and examined. History obtained directly from patient and husband at bedside.  Patient states that  she tripped over an object that was on the floor.  Labs/EKG: None ordered  Imaging: Ordered CT head, maxillofacial, cervical spine..  Medications/Fluids: None ordered  Most recent vital signs reviewed and are as follows: BP (!) 157/89 (BP Location: Right Arm)   Pulse 68   Temp 97.7 F (36.5 C) (Oral)   Resp 18   Ht 5\' 3"  (1.6 m)   Wt 62 kg   SpO2 100%   BMI 24.21 kg/m   Initial impression: Mechanical fall, minor head injury, no anticoagulation.  8:59 PM Reassessment performed. Patient appears stable.  Imaging personally visualized and interpreted including: CT head, maxillofacial, cervical spine, agree negative.  Reviewed pertinent lab work and imaging with patient at bedside. Questions answered.   Most current vital signs reviewed and are as follows: BP (!) 157/89 (BP Location: Right Arm)   Pulse 68   Temp 97.7 F (36.5 C) (Oral)   Resp 18   Ht 5\' 3"  (1.6 m)   Wt 62 kg   SpO2 100%   BMI 24.21 kg/m   Plan: Discharge to home.   Prescriptions written for: None  Other home care instructions discussed: Ice, OTC meds  ED return instructions discussed: Patient was counseled on head injury precautions and symptoms that should indicate their return to the ED.  These include severe worsening headache, vision changes, confusion, loss of consciousness, trouble walking, nausea & vomiting, or weakness/tingling in extremities.    Follow-up instructions discussed: Patient encouraged to follow-up with their PCP as needed.                                   Medical Decision Making Amount and/or Complexity of Data Reviewed Radiology:  ordered.   Patient with facial contusion after mechanical fall today.  Imaging of the head, facial bones and cervical spine are negative.  Patient will do routine care.  Low concern for concussion.  She looks well.  Mental status and exam stable during ED stay.  No decompensation.  The patient's vital signs, pertinent lab work and imaging were reviewed and interpreted as discussed in the ED course. Hospitalization was considered for further testing, treatments, or serial exams/observation. However as patient is well-appearing, has a stable exam, and reassuring studies today, I do not feel that they warrant admission at this time. This plan was discussed with the patient who verbalizes agreement and comfort with this plan and seems reliable and able to return to the Emergency Department with worsening or changing symptoms.          Final Clinical Impression(s) / ED Diagnoses Final diagnoses:  Contusion of face, initial encounter  Fall, initial encounter    Rx / DC Orders ED Discharge Orders     None         Renne Crigler, Cordelia Poche 10/16/22 2101    Vanetta Mulders, MD 10/16/22 2308

## 2022-10-16 NOTE — ED Notes (Signed)
D/c paperwork reviewed with pt, including follow up care.  All questions and/or concerns addressed at time of d/c.  No further needs expressed. . Pt verbalized understanding, Ambulatory with family to ED exit, NAD.   

## 2022-10-16 NOTE — ED Notes (Signed)
Pt transported to imaging.

## 2022-10-16 NOTE — Discharge Instructions (Signed)
Please read and follow all provided instructions.  Your diagnoses today include:  1. Contusion of face, initial encounter   2. Fall, initial encounter     Tests performed today include: CT scan of your head, facial bones, and neck that did not show any serious injury. Vital signs. See below for your results today.   Medications prescribed:  None  Take any prescribed medications only as directed.  Home care instructions:  Follow any educational materials contained in this packet.  BE VERY CAREFUL not to take multiple medicines containing Tylenol (also called acetaminophen). Doing so can lead to an overdose which can damage your liver and cause liver failure and possibly death.   Follow-up instructions: Please follow-up with your primary care provider in the next 3 days for further evaluation of your symptoms.   Return instructions:  SEEK IMMEDIATE MEDICAL ATTENTION IF: There is confusion or drowsiness (although children frequently become drowsy after injury).  You cannot awaken the injured person.  You have more than one episode of vomiting.  You notice dizziness or unsteadiness which is getting worse, or inability to walk.  You have convulsions or unconsciousness.  You experience severe, persistent headaches not relieved by Tylenol. You cannot use arms or legs normally.  There are changes in pupil sizes. (This is the black center in the colored part of the eye)  There is clear or bloody discharge from the nose or ears.  You have change in speech, vision, swallowing, or understanding.  Localized weakness, numbness, tingling, or change in bowel or bladder control. You have any other emergent concerns.  Additional Information: You have had a head injury which does not appear to require admission at this time.  Your vital signs today were: BP (!) 157/89 (BP Location: Right Arm)   Pulse 68   Temp 97.7 F (36.5 C) (Oral)   Resp 18   Ht 5\' 3"  (1.6 m)   Wt 62 kg   SpO2 100%    BMI 24.21 kg/m  If your blood pressure (BP) was elevated above 135/85 this visit, please have this repeated by your doctor within one month. --------------

## 2022-10-16 NOTE — ED Triage Notes (Signed)
The patient tripped and fell and hit her head. No LOC. She has bruising around her left eye. No blood thinner use.
